# Patient Record
Sex: Female | Born: 1965 | Race: Black or African American | Hispanic: No | Marital: Single | Smoking: Never smoker
Health system: Southern US, Community
[De-identification: ages and names within clinical notes are randomized; demographics above are authoritative.]

## PROBLEM LIST (undated history)

## (undated) DIAGNOSIS — I10 Essential (primary) hypertension: Secondary | ICD-10-CM

---

## 2003-12-09 ENCOUNTER — Emergency Department (HOSPITAL_COMMUNITY): Admission: EM | Admit: 2003-12-09 | Discharge: 2003-12-10 | Payer: Self-pay | Admitting: Emergency Medicine

## 2003-12-09 ENCOUNTER — Emergency Department (HOSPITAL_COMMUNITY): Admission: EM | Admit: 2003-12-09 | Discharge: 2003-12-09 | Payer: Self-pay | Admitting: Emergency Medicine

## 2006-07-12 ENCOUNTER — Ambulatory Visit: Payer: Self-pay | Admitting: Cardiology

## 2006-07-12 ENCOUNTER — Inpatient Hospital Stay (HOSPITAL_COMMUNITY): Admission: EM | Admit: 2006-07-12 | Discharge: 2006-07-14 | Payer: Self-pay | Admitting: Emergency Medicine

## 2006-07-12 ENCOUNTER — Ambulatory Visit: Payer: Self-pay | Admitting: Family Medicine

## 2006-07-13 ENCOUNTER — Encounter: Payer: Self-pay | Admitting: Vascular Surgery

## 2006-07-13 ENCOUNTER — Encounter: Payer: Self-pay | Admitting: Cardiology

## 2006-12-25 ENCOUNTER — Emergency Department (HOSPITAL_COMMUNITY): Admission: EM | Admit: 2006-12-25 | Discharge: 2006-12-26 | Payer: Self-pay | Admitting: Emergency Medicine

## 2006-12-26 ENCOUNTER — Ambulatory Visit: Payer: Self-pay | Admitting: Psychiatry

## 2006-12-26 ENCOUNTER — Inpatient Hospital Stay (HOSPITAL_COMMUNITY): Admission: EM | Admit: 2006-12-26 | Discharge: 2006-12-28 | Payer: Self-pay | Admitting: Psychiatry

## 2007-04-23 ENCOUNTER — Emergency Department: Payer: Self-pay | Admitting: Emergency Medicine

## 2010-12-16 NOTE — H&P (Signed)
NAMEKRISTELL, [name]              ACCOUNT NO.:  [id number]   MEDICAL RECORD NO.:  [id number]          PATIENT TYPE:  INP   LOCATION:  3737                         FACILITY:  [facility]   PHYSICIAN:  Dwana Curd. Para March, M.D. DATE OF BIRTH:  01-20-1966   DATE OF ADMISSION:  07/12/2006  DATE OF DISCHARGE:                              HISTORY & PHYSICAL   CHIEF COMPLAINT:  Chest pain.   The patient is a 45 year old African-American female who has not seen a  doctor in years.  She had sudden onset chest pain and left arm pain last  night.  She has had a significant increase in her stress recently, both  at school and at work.  Her pain continued throughout the night.  She  states she stayed awake throughout the night because of the pain.  It  was still present this morning.  She went to work and says she was  overwhelmed at work over the stressful situation.  The chest pain  increased, and left arm pain progressed, having concurrent numbness.  She came to the emergency room via EMS.  After she was treated in the ED  with aspirin, nitroglycerin, and Ativan, her pain significantly  decreased.   She did have a similar period of chest pain several years ago where the  pain was controlled by taking Valium.  She says this was a short course  of treatment.   PAST MEDICAL HISTORY:  No hospitalizations.  No history of hypertension.  No history of diabetes.  She is obese.  The patient does have left arm  and leg weakness that is chronic.  It has been like this for several  months.  It was a sudden onset change earlier this year.   FAMILY HISTORY:  Her father is alive.  He has hypertension and has had  an MI.  Her mother is alive with diabetes.  Her son is obese.   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  She is also in graduate school at [employer].  She does not use tobacco nor alcohol nor illicit drugs.   DRUG ALLERGIES:  NO KNOWN  DRUG ALLERGIES.   MEDICATIONS:  None.   REVIEW OF SYSTEMS:  No fevers, no chills.  Positive shortness of breath  and chest pain, listed above.  No bright red blood per rectum.  Positive  bilateral lower extremity, especially in the p.m., especially after  eating salty food.  Review of systems otherwise noncontributory.   PHYSICAL EXAMINATION:  Temperature 97.2, pulse 88, respiratory rate 26,  blood pressure 156/79, SGOT of 100% on room air.  GENERAL:  Patient is  in no apparent distress.  HEENT:  Atraumatic, normocephalic.  Mucous membranes are moist.  Extraocular movements are intact and pupils equal, round, and reactive  to light.  Tympanic membranes within normal limits bilaterally.  NECK:  Supple.  No lymphadenopathy, no thyromegaly.  No JVD appreciated,  no bruit.  CHEST:  Clear to auscultation bilaterally.  Chest wall is minimally  tender to palpation.  The left  trapezius is tender to palpation, more so  than the rest of the chest wall.  The heart is a regular rate and rhythm  with a soft systolic ejection murmur appreciated.  Abdomen:  Soft,  positive bowel sounds, obese, nontender.  GU is deferred.  I am unable  to roll the patient in the bed secondary to her habitus to perform a  rectal exam.  EXTREMITIES:  2+ bilateral lower extremity edema.  SKIN:  No rash.  NEUROLOGIC:  Cranial nerves intact grossly bilaterally.  Cranial nerves  grossly intact bilaterally.  In terms of motor function, the patient is  diffusely weak on the left side as compared to the right.  She is weak  in forearm flexion and extension, also with grip in the left compared to  the right.  Her plantar and dorsal flexion is weaker on the left  compared to the right.  Her reflexes are diminished on the left upper  and lower extremity compared to the right.  Gait was not tested.   LABORATORY DATA:  EKG shows sinus arrhythmia with 90 beats per minute,  no ST changes.  BNP less than 30.  Point of care  troponin enzyme 0.1.  Chest x-ray is negative showing no acute cardiopulmonary disease.  Head  CT is negative.  Electrolytes:  Sodium 137, potassium 3.8, chloride 106,  bicarb 23.5, BUN 9, creatinine 0.8, glucose 151.  Blood gas 7.369.   ASSESSMENT AND PLAN:  The patient is a 45 year old African-American  female with the following problems:  1. Chest pain:  Anxiety versus coronary versus gastrointestinal      source.  Will admit her and rule out for myocardial infarction with      cardiac enzymes and EKG.  She should be placed on PPI for possible      heartburn.  This would be less likely.  Anxiety is high on the      differential.  She is being treated with aspirin and nitroglycerin      p.r.n.  We will also check a TSH.  PE is highly unlikely;      therefore, I did not pursue workup with a D-dimer or angiogram.  2. Increased blood pressure:  Will follow this.  Patient will likely      need antihypertensive treatment.  I did not see the need to acutely      lower her blood pressure tonight.  See problem #3.  3. Possible cerebrovascular accident.  Her CT is negative.  Will      likely need an OT/PT counsel.  Will also check Dopplers, even      though I did not hear a bruit in her neck.  I can discuss this with      an inpatient team in the morning.  4. Obesity:  Given all of her risk factors above, it is reasonable to      check a fasting lipid panel, A1c, and follow her CBGs.  She may, in      fact, have a frank diagnosis of diabetes with multiple fasting CBGs      that are elevated.  5. SCDs for DVT prophylaxis.  6. Admit.  7. Code status:  I had a frank discussion with the patient about her      code status.  She wishes to be DNR.  I explained to her that this      would mean no mechanical support for ventilation and no CPR.  She  opts for this should she become apneic or asystolic.      Dwana Curd Para March, M.D.    GSD/MEDQ  D:  07/13/2006  T:  07/13/2006  Job:  [id number]

## 2010-12-16 NOTE — Discharge Summary (Signed)
[name], [name]              ACCOUNT NO.:  [id number]   MEDICAL RECORD NO.:  [id number]          PATIENT TYPE:  INP   LOCATION:  3737                         FACILITY:  [facility]   PHYSICIAN:  Broadus John T. Pickard II, MDDATE OF BIRTH:  [birth year]   DATE OF ADMISSION:  07/12/2006  DATE OF DISCHARGE:                               DISCHARGE SUMMARY   PRIMARY CARE PHYSICIAN:  Unassigned.   DISCHARGE DIAGNOSES:  1. Atypical chest pain.  Rule out myocardial infarction.  2. Left upper extremity paresthesias.  Rule out cerebral vascular      accident.  3. Hypertension.  4. New onset diabetes mellitus type 2.  5. Gastroesophageal reflux disease.   PROCEDURES:  1. Patient had a 2-D echo of the chest performed on July 13, 2006,      which showed an ejection fraction of 70%.  There were no left      ventricular regional wall abnormalities.  There was no cardiac      embolic source of embolization.  2. Patient had 2-D carotid Dopplers performed on July 13, 2006.      The preliminary report shows no ICA stenosis or no embolic source      for a CVA.  3. Patient had a head CT on July 12, 2006, which showed no acute      disease, no evidence of a stroke.   LABORATORY DATA:  Patient had cardiac enzymes negative x3.  She has an  H. pylori test pending at the time of this dictation.  She had a  hemoglobin A1c of 8.5.  She had a fasting lipid panel showed  triglycerides of 181, HDL 48, LDL 146.  TSH 1.221.  Her BMP shows sodium  136, potassium 3.4, chloride 103, bicarb 26, glucose 214, BUN 11,  creatinine 0.7.   HISTORY AND PHYSICAL:  Please see the dictated history and physical in  the chart.  In short, the patient is a 45 year old morbidly obese  African-American female who presented with atypical chest pain, and left  upper extremity paresthesias.  She was admitted for those above reasons.   HOSPITAL COURSE:  1. Chest pain.  Patient was admitted to telemetry and had a cardiac     rule out for myocardial infarction.  She had EKGs that were      negative for acute ST elevations.  She had cardiac enzymes negative      x3.  It was felt that the cause of her chest pain was likely a GI      pathology coming from the esophagus or stomach.  She was started on      PPI twice daily and her chest pain resolved.  She is to be      scheduled for outpatient Cardiolite given her multiple risk      factors.  Would appreciate if Dr. Tanya Nones would schedule this as an      outpatient with [facility] at her      followup this week.  2. Left upper extremity paresthesias.  Question of whether or not this  was somatic secondary to stress versus a true CVA.  She had been      under increased stress as she is getting 2 masters degrees, she is      a single mother, and also working 2 jobs.  She recently had a lot      of papers due for school and had felt a lot of stress with that and      it was some concern that the paraesthesia may be related to that or      possibly her diabetes.  She had a head CT that was negative for any      evidence of a stroke.  She had carotid Dopplers that were negative      for any ICA stenosis.  She had a 2-D echo of the chest that showed      no embolic source of the heart.  She is to be sent home on aspirin      81 mg p.o. daily.  If her symptoms persist, I would get an MRI as      an outpatient to evaluate and see if she truly has had a stroke.      If there is evidence of a stroke on the MRI will then switch her      from aspirin to Aggrenox therapy for secondary prevention.  We are      to work on multiple co-morbidities such as her obesity, her      hypertension, her diabetes, as well as her hyperlipidemia.  3. New onset diabetes mellitus type 2.  Patient had CBGs in hospital      that ranged from 156 to 236.  She had a hemoglobin A1c in the      hospital that was found to be 8.5.  A long discussion was held with       the patient and this was likely related to her morbid obesity.  We      recommended starting medical therapy for this at this point.  Given      the fact that she is asymptomatic and average blood sugars ranging      around 200, we started her on metformin 500 mg p.o. b.i.d.  We will      up titrate over the next couple weeks to a maximum dose of 1000 mg      p.o. b.i.d. in an effort to get her hemoglobin A1c less than 6.3.      As a secondary adjunctive therapy, we will also arrange her to meet      with the diabetic nutrition management center as an outpatient.  4. GI source of chest pain.  Patient was started on Protonix 40 mg      p.o. b.i.d.  Her chest pain got better.  We want to get an upper GI      series in an inpatient; however, due to scheduling conflicts over      the weekend this is not possible.  Therefore, her followup      appointment this week, we would appreciate if Dr. Tanya Nones would      arrange her for an upper GI series to evaluate for esophageal      spasm, stricture, hiatal hernia, or another GI source of her chest      pain.  She is being sent home on Protonix 40 mg p.o. b.i.d. in the      meantime.  H. pylori tests are pending at  the time of this      dictation and if she is H. pylori positive, use 2 weeks antibiotic      therapy.  5. Hypertension.  Blood pressures in the hospital range from 130-      150/98-99.  Started on lisinopril 10 mg p.o. daily since she is a      diabetic.  Would appreciate if Dr. Tanya Nones would check a BMP at her      followup appointment to evaluate her creatinine on the lisinopril.      Would also get urine microalbumin at her followup appointment.  6. Hyperlipidemia.  Her LDL is not at goal.  Recommended diet and      exercise over the next 3 months.  Also recommend over the counter      fish oil tablets as well as almonds and meeting with the diabetic     nutrition management center.  In 3 months if her LDL is not at the      goal of  100, I would then start a statin therapy.  She is      recalcitrant to starting at this point and so we will give her some      time to see if she can bring it down on her own.   DISCHARGE INSTRUCTIONS:  Patient is instructed to call [facility] at [phone] and arrange a followup appointment this week  with Dr. Tanya Nones.   DISCHARGE MEDICATIONS:  1. Aspirin 81 mg p.o. daily.  2. Metformin 500 mg 1 p.o. b.i.d.  3. Lisinopril 10 mg p.o. daily.  4. Protonix 40 mg p.o. b.i.d.      Broadus John T. Pamalee Leyden, MD     WTP/MEDQ  D:  07/14/2006  T:  07/15/2006  Job:  [id number]   cc:   Broadus John T. Pamalee Leyden, MD

## 2010-12-16 NOTE — Discharge Summary (Signed)
[name], [name]              ACCOUNT NO.:  [id number]   MEDICAL RECORD NO.:  [id number]          PATIENT TYPE:  IPS   LOCATION:  0602                          [facility]ILITY:  [facility]   PHYSICIAN:  Anselm Jungling, MD  DATE OF BIRTH:  1966/05/10   DATE OF ADMISSION:  12/26/2006  DATE OF DISCHARGE:  12/28/2006                               DISCHARGE SUMMARY   IDENTIFYING DATA/REASON FOR ADMISSION:  This was an inpatient  psychiatric admission for [name], a 45 year old woman admitted due to  increasing and untreated depression, and suicidal ideation with plan to  overdose on her diabetic medication.  She is a single mother, working,  and attending graduate school.  She was experiencing financial stressors  and was generally overwhelmed by all of her activities and  responsibilities.  Please refer to the admission note for further  details pertaining to the symptoms, circumstances and history that led  to her hospitalization.   INITIAL DIAGNOSTIC IMPRESSION:  She was given an initial AXIS I  diagnosis of depressive disorder not otherwise specified.   MEDICAL/LABORATORY:  The patient came to [place] as a diabetic, on a regimen  of metformin.  She was medically and physically assessed by the  psychiatric nurse practitioner, and her diabetic regimen was monitored  closely during her stay.  There were no significant medical issues.   HOSPITAL COURSE:  The patient was admitted to the adult inpatient  psychiatric service.  She presented as a well-nourished, well-developed  woman who was alert, fully oriented, and very pleasant but sad.  She  showed bright intellect, was articulate, and a dry wit.  There were no  signs or symptoms of psychosis or thought disorder.  She admitted to  suicidal ideation prior to admission, but once admitted, indicated that  she was no longer feeling that way and verbalized a strong desire for  help.  She denied any history of alcohol or substance abuse.   She was  involved in the therapeutic milieu and attended various groups  and activities geared towards helping her acquire better coping skills,  a better understanding of her underlying depression and the development  of an aftercare plan.   She had never been treated with any psychotropic medication for  regulation of mood, and agreed to a trial of Zoloft 50 mg daily.   On the third hospital day, the patient had remained absent suicidal  ideation, was much more hopeful, and was tolerating her initial Zoloft.  She indicated that she felt ready to continue treatment in the  outpatient setting.  She agreed to the following aftercare plan.   AFTERCARE:  The patient was to report to the [facility] on January 01, 2007 for an intake appointment with Dr. Mikey Bussing at 8:30 a.m.   DISCHARGE MEDICATIONS:  1. Zoloft 50 mg daily.  2. Metformin 500 mg b.i.d.  3. Lescol 20 mg daily.   DISCHARGE DIAGNOSES:  AXIS I:  Major depressive episode.  AXIS II:  Deferred.  AXIS III:  History of diabetes mellitus.  AXIS IV:  Stressors:  Severe.  AXIS V:  GAF on  discharge 70.      Anselm Jungling, MD  Electronically Signed     SPB/MEDQ  D:  12/31/2006  T:  01/01/2007  Job:  [id number]

## 2011-11-20 ENCOUNTER — Emergency Department (HOSPITAL_COMMUNITY)
Admission: EM | Admit: 2011-11-20 | Discharge: 2011-11-20 | Disposition: A | Discharge status: Home / Self Care | Payer: Self-pay | Attending: Emergency Medicine | Admitting: Emergency Medicine

## 2011-11-20 ENCOUNTER — Encounter (HOSPITAL_COMMUNITY): Payer: Self-pay | Admitting: *Deleted

## 2011-11-20 ENCOUNTER — Emergency Department (HOSPITAL_COMMUNITY): Payer: Self-pay

## 2011-11-20 DIAGNOSIS — R079 Chest pain, unspecified: Secondary | ICD-10-CM | POA: Insufficient documentation

## 2011-11-20 DIAGNOSIS — J45909 Unspecified asthma, uncomplicated: Secondary | ICD-10-CM | POA: Insufficient documentation

## 2011-11-20 DIAGNOSIS — I10 Essential (primary) hypertension: Secondary | ICD-10-CM | POA: Insufficient documentation

## 2011-11-20 DIAGNOSIS — R0602 Shortness of breath: Secondary | ICD-10-CM | POA: Insufficient documentation

## 2011-11-20 DIAGNOSIS — R509 Fever, unspecified: Secondary | ICD-10-CM | POA: Insufficient documentation

## 2011-11-20 DIAGNOSIS — E119 Type 2 diabetes mellitus without complications: Secondary | ICD-10-CM | POA: Insufficient documentation

## 2011-11-20 DIAGNOSIS — J3489 Other specified disorders of nose and nasal sinuses: Secondary | ICD-10-CM | POA: Insufficient documentation

## 2011-11-20 DIAGNOSIS — J111 Influenza due to unidentified influenza virus with other respiratory manifestations: Secondary | ICD-10-CM | POA: Insufficient documentation

## 2011-11-20 DIAGNOSIS — IMO0001 Reserved for inherently not codable concepts without codable children: Secondary | ICD-10-CM | POA: Insufficient documentation

## 2011-11-20 DIAGNOSIS — R110 Nausea: Secondary | ICD-10-CM | POA: Insufficient documentation

## 2011-11-20 DIAGNOSIS — R5381 Other malaise: Secondary | ICD-10-CM | POA: Insufficient documentation

## 2011-11-20 HISTORY — DX: Essential (primary) hypertension: I10

## 2011-11-20 LAB — GLUCOSE, CAPILLARY: Glucose-Capillary: 240 mg/dL — ABNORMAL HIGH (ref 70–99)

## 2011-11-20 LAB — BASIC METABOLIC PANEL
Creatinine, Ser: 0.67 mg/dL (ref 0.50–1.10)
GFR calc non Af Amer: >90 mL/min (ref >90)
Glucose, Bld: 233 mg/dL — ABNORMAL HIGH (ref 70–99)
Potassium: 3.5 mEq/L (ref 3.5–5.1)
Sodium: 135 mEq/L (ref 135–145)

## 2011-11-20 LAB — CBC
Hemoglobin: 12.6 g/dL (ref 12.0–15.0)
MCV: 88.4 fL (ref 78.0–100.0)
WBC: 9.9 10*3/uL (ref 4.0–10.5)

## 2011-11-20 LAB — POCT I-STAT, CHEM 8
BUN: 9 mg/dL (ref 6–23)
Calcium, Ion: 1.21 mmol/L (ref 1.12–1.32)
Creatinine, Ser: 0.6 mg/dL (ref 0.50–1.10)
Glucose, Bld: 223 mg/dL — ABNORMAL HIGH (ref 70–99)
Potassium: 3.6 mEq/L (ref 3.5–5.1)

## 2011-11-20 LAB — TROPONIN I: Troponin I: <0.3 ng/mL (ref <0.30)

## 2011-11-20 MED ORDER — OSELTAMIVIR PHOSPHATE 75 MG PO CAPS
75.0000 mg | ORAL_CAPSULE | Freq: Once | ORAL | Status: AC
  Administered 2011-11-20: 75 mg via ORAL
  Filled 2011-11-20 (×2): qty 1

## 2011-11-20 MED ORDER — OSELTAMIVIR PHOSPHATE 75 MG PO CAPS: 75.0000 mg | ORAL_CAPSULE | Freq: Two times a day (BID) | ORAL | Status: AC

## 2011-11-20 MED ORDER — OSELTAMIVIR PHOSPHATE 75 MG PO CAPS: 75.0000 mg | ORAL_CAPSULE | Freq: Two times a day (BID) | ORAL | Status: DC

## 2011-11-20 MED ORDER — SODIUM CHLORIDE 0.9 % IV BOLUS (SEPSIS)
1000.0000 mL | Freq: Once | INTRAVENOUS | Status: AC
  Administered 2011-11-20: 1000 mL via INTRAVENOUS

## 2011-11-20 NOTE — ED Notes (Signed)
Patient currently sitting up in bed; no respiratory or acute distress noted.  Patient updated on plan of care; informed patient that we are currently waiting for bolus to finish.  Patient has no other questions or concerns at this time; will continue to monitor.

## 2011-11-20 NOTE — ED Notes (Signed)
Received bedside report from [place], [place].  Patient currently sitting up in bed; no respiratory or acute distress noted.  Patient updated on plan of care; informed patient that lab is going to be by to do lab work and that she is going to go for x-ray for a chest x-ray.  Patient has no other questions or concerns at this time; will continue to monitor.

## 2011-11-20 NOTE — ED Notes (Signed)
Waiting for Tamiflu from pharmacy before discharging patient.

## 2011-11-20 NOTE — ED Notes (Signed)
Patient asking for something to eat; patient given [place] sandwich Gildardo Cranker by FNP).

## 2011-11-20 NOTE — ED Notes (Signed)
Gail, FNP at bedside. 

## 2011-11-20 NOTE — ED Notes (Signed)
Patient currently resting quietly in bed; no respiratory or acute distress noted.  No respiratory or acute distress noted.  Patient updated on plan of care; informed patient that we are waiting for lab results to come back.  Patient has no other questions or concerns at this time; will continue to monitor.

## 2011-11-20 NOTE — ED Notes (Signed)
PT family member drove PT to ED after finding her [facility] and having CP . CBG 220 . PT to weak to stand

## 2011-11-20 NOTE — ED Provider Notes (Signed)
History     CSN: [id number]  Arrival date & time 11/20/11  1716   First MD Initiated Contact with Patient 11/20/11 2006      Chief Complaint  Patient presents with  . Chest Pain    (Consider location/radiation/quality/duration/timing/severity/associated sxs/prior treatment) HPI Comments: Patient stated that Saturday night.  She felt slightly uncomfortable in her nasal area with slight congestion.  Sunday.  The same.  This morning she woke up and went to work, but had acute onset of fever, myalgias, increased nasal congestion, sinus pain, a slight feeling of shortness of breath on exertion, fatigue.  She was sent home from work, took an Alka-Seltzer plus blade, down and felt worse.  She called a friend to bring her to the emergency room because of her myalgias and feeling short of breath.  She is a non-insulin-dependent diabetic.  His sugars normally run between 100 and 180.  She had one episode of vomiting and loose stool just prior to arrival  Patient is a 46 y.o. female presenting with chest pain. The history is provided by the patient.  Chest Pain The chest pain began yesterday. Chest pain occurs constantly. The chest pain is worsening. At its most intense, the pain is at 3/10. The pain is currently at 3/10. The severity of the pain is moderate. The quality of the pain is described as aching. Primary symptoms include a fever, shortness of breath and nausea. Pertinent negatives for primary symptoms include no cough and no dizziness.  Associated symptoms include weakness.  Pertinent negatives for associated symptoms include no diaphoresis.     Past Medical History  Diagnosis Date  . Diabetes mellitus   . Hypertension   . Asthma     Past Surgical History  Procedure Date  . Cesarean section     No family history on file.  History  Substance Use Topics  . Smoking status: Not on file  . Smokeless tobacco: Not on file  . Alcohol Use:     OB History    Grav Para Term Preterm  Abortions TAB SAB Ect Mult Living                  Review of Systems  Constitutional: Positive for fever. Negative for chills and diaphoresis.  HENT: Positive for sinus pressure. Negative for sore throat.   Respiratory: Positive for shortness of breath. Negative for cough.   Cardiovascular: Positive for chest pain.  Gastrointestinal: Positive for nausea.  Neurological: Positive for weakness. Negative for dizziness and headaches.    Allergies  Review of patient's allergies indicates no known allergies.  Home Medications   Current Outpatient Rx  Name Route Sig Dispense Refill  . B-COMPLEX PO Oral Take 1 tablet by mouth daily.    . ADULT MULTIVITAMIN W/MINERALS CH Oral Take 1 tablet by mouth daily.    . OSELTAMIVIR PHOSPHATE 75 MG PO CAPS Oral Take 1 capsule (75 mg total) by mouth every 12 (twelve) hours. 10 capsule 0    BP 178/88  Pulse 104  Temp(Src) 98.7 F (37.1 C) (Oral)  Resp 18  SpO2 100%  LMP 11/06/2011  Physical Exam  Constitutional: She is oriented to person, place, and time. She appears well-developed and well-nourished.  HENT:  Head: Normocephalic and atraumatic.  Nose: Right sinus exhibits maxillary sinus tenderness and frontal sinus tenderness. Left sinus exhibits maxillary sinus tenderness and frontal sinus tenderness.  Eyes: Pupils are equal, round, and reactive to light.  Neck: Normal range of motion.  Pulmonary/Chest:  She has no wheezes. She exhibits no tenderness.  Abdominal: Soft.  Musculoskeletal: Normal range of motion.  Neurological: She is alert and oriented to person, place, and time.  Skin: Skin is warm and dry.    ED Course  Procedures (including critical care time)  Labs Reviewed  GLUCOSE, CAPILLARY - Abnormal; Notable for the following:    Glucose-Capillary 240 (*)    All other components within normal limits  BASIC METABOLIC PANEL - Abnormal; Notable for the following:    Glucose, Bld 233 (*)    All other components within normal  limits  POCT I-STAT, CHEM 8 - Abnormal; Notable for the following:    Glucose, Bld 223 (*)    All other components within normal limits  CBC  TROPONIN I  POCT I-STAT TROPONIN I   Dg Chest 2 View  11/20/2011  *RADIOLOGY REPORT*  Clinical Data: Left chest pain, shortness of breath  CHEST - 2 VIEW  Comparison: 07/13/2006  Findings: Lungs clear.  Heart size and pulmonary vascularity normal.  No effusion.  Visualized bones unremarkable.  IMPRESSION: No acute disease  Original Report Authenticated By: Thora Lance III, M.D.     1. Influenza       MDM  After careful examination, and review of this patient's symptoms.  I feel that this is an acute viral syndrome versus influenza.  Do not feel this is cardiac in nature, but will check EKG chest x-ray, and one set of cardiac enzymes.  I have asked that she be hydrated 10:30 PM patient is feeling significantly better has eaten a sandwich without nausea.  Will discharge home with the diagnoses of influenza       Arman Filter, NP 11/21/11 0606  Arman Filter, NP 11/21/11 [phone]

## 2011-11-20 NOTE — ED Notes (Signed)
Patient currently sitting up in bed; no respiratory or acute distress noted.  Patient updated on plan of care; informed patient that we are currently waiting on disposition from FNP.  Patient has no other questions or concerns at this time; will continue to monitor.

## 2011-11-21 NOTE — ED Provider Notes (Signed)
Medical screening examination/treatment/procedure(s) were performed by non-physician practitioner and as supervising physician I was immediately available for consultation/collaboration.   Glynn Octave, MD 11/21/11 1218

## 2012-10-06 ENCOUNTER — Encounter (HOSPITAL_COMMUNITY): Payer: Self-pay | Admitting: *Deleted

## 2012-10-06 ENCOUNTER — Emergency Department (HOSPITAL_COMMUNITY)
Admission: EM | Admit: 2012-10-06 | Discharge: 2012-10-06 | Disposition: A | Discharge status: Home / Self Care | Payer: No Typology Code available for payment source | Attending: Emergency Medicine | Admitting: Emergency Medicine

## 2012-10-06 ENCOUNTER — Emergency Department (HOSPITAL_COMMUNITY): Payer: No Typology Code available for payment source

## 2012-10-06 DIAGNOSIS — T628X1A Toxic effect of other specified noxious substances eaten as food, accidental (unintentional), initial encounter: Secondary | ICD-10-CM | POA: Insufficient documentation

## 2012-10-06 DIAGNOSIS — L299 Pruritus, unspecified: Secondary | ICD-10-CM | POA: Insufficient documentation

## 2012-10-06 DIAGNOSIS — R062 Wheezing: Secondary | ICD-10-CM | POA: Insufficient documentation

## 2012-10-06 DIAGNOSIS — M7989 Other specified soft tissue disorders: Secondary | ICD-10-CM | POA: Insufficient documentation

## 2012-10-06 DIAGNOSIS — L272 Dermatitis due to ingested food: Secondary | ICD-10-CM | POA: Insufficient documentation

## 2012-10-06 DIAGNOSIS — I10 Essential (primary) hypertension: Secondary | ICD-10-CM | POA: Insufficient documentation

## 2012-10-06 DIAGNOSIS — M25579 Pain in unspecified ankle and joints of unspecified foot: Secondary | ICD-10-CM | POA: Insufficient documentation

## 2012-10-06 DIAGNOSIS — L509 Urticaria, unspecified: Secondary | ICD-10-CM | POA: Insufficient documentation

## 2012-10-06 DIAGNOSIS — Y92009 Unspecified place in unspecified non-institutional (private) residence as the place of occurrence of the external cause: Secondary | ICD-10-CM | POA: Insufficient documentation

## 2012-10-06 DIAGNOSIS — R1110 Vomiting, unspecified: Secondary | ICD-10-CM | POA: Insufficient documentation

## 2012-10-06 DIAGNOSIS — Y9389 Activity, other specified: Secondary | ICD-10-CM | POA: Insufficient documentation

## 2012-10-06 DIAGNOSIS — R6889 Other general symptoms and signs: Secondary | ICD-10-CM | POA: Insufficient documentation

## 2012-10-06 DIAGNOSIS — Z79899 Other long term (current) drug therapy: Secondary | ICD-10-CM | POA: Insufficient documentation

## 2012-10-06 DIAGNOSIS — J45909 Unspecified asthma, uncomplicated: Secondary | ICD-10-CM | POA: Insufficient documentation

## 2012-10-06 DIAGNOSIS — E119 Type 2 diabetes mellitus without complications: Secondary | ICD-10-CM | POA: Insufficient documentation

## 2012-10-06 LAB — CBC WITH DIFFERENTIAL/PLATELET
Basophils Absolute: 0 10*3/uL (ref 0.0–0.1)
Basophils Relative: 0 % (ref 0–1)
HCT: 42.9 % (ref 36.0–46.0)
MCH: 29.7 pg (ref 26.0–34.0)
MCHC: 33.3 g/dL (ref 30.0–36.0)
Monocytes Absolute: 0.4 10*3/uL (ref 0.1–1.0)
Neutrophils Relative %: 86 % — ABNORMAL HIGH (ref 43–77)
Platelets: 253 10*3/uL (ref 150–400)
RBC: 4.82 MIL/uL (ref 3.87–5.11)
WBC: 15.5 10*3/uL — ABNORMAL HIGH (ref 4.0–10.5)

## 2012-10-06 LAB — COMPREHENSIVE METABOLIC PANEL
ALT: 10 U/L (ref 0–35)
AST: 14 U/L (ref 0–37)
Albumin: 3.3 g/dL — ABNORMAL LOW (ref 3.5–5.2)
BUN: 11 mg/dL (ref 6–23)
CO2: 22 mEq/L (ref 19–32)
Glucose, Bld: 340 mg/dL — ABNORMAL HIGH (ref 70–99)
Total Protein: 7.7 g/dL (ref 6.0–8.3)

## 2012-10-06 MED ORDER — PREDNISONE 50 MG PO TABS: ORAL_TABLET | ORAL | Status: DC

## 2012-10-06 MED ORDER — DIPHENHYDRAMINE HCL 25 MG PO TABS: 25.0000 mg | ORAL_TABLET | Freq: Four times a day (QID) | ORAL | Status: DC

## 2012-10-06 MED ORDER — EPINEPHRINE 0.3 MG/0.3ML IJ DEVI: 0.3000 mg | INTRAMUSCULAR | Status: DC | PRN

## 2012-10-06 MED ORDER — DIPHENHYDRAMINE HCL 50 MG/ML IJ SOLN
25.0000 mg | Freq: Once | INTRAMUSCULAR | Status: AC
  Administered 2012-10-06: 25 mg via INTRAVENOUS
  Filled 2012-10-06: qty 1

## 2012-10-06 NOTE — ED Notes (Addendum)
Per ems pt is from home. Hx of diabetes. Pt reports this morning she ate "possibly some bad clearance ham". Reports ham tasted funny and throat felt funny. Pt went to [facility] to get benadryl, [facility] was out of benadryl. Pt called ems. Upon arrival pt diaphoretic, hives, vomiting, and reports sudden onset feet swelling. Pain in feet 8/10. No wheezing noted. Pt had IV established given 50mg  benadryl, 50mg  zantac, 125mg  solumedrol. IV dislodged while pt was getting dressed.

## 2012-10-06 NOTE — ED Provider Notes (Signed)
History     CSN: [id number]  Arrival date & time 10/06/12  1527   First MD Initiated Contact with Patient 10/06/12 [phone]      Chief Complaint  Patient presents with  . Allergic Reaction    (Consider location/radiation/quality/duration/timing/severity/associated sxs/prior treatment) HPI Comments: Patient presents from home with allergic reaction after eating ham. She states she hit her on 11:30 AM and then immediately began to feel short of breath with tightness in her throat and diffuse itching and hives. She went to the store to buy Benadryl but did not have any so she called EMS. EMS found her to be diaphoretic with hives and diffuse itching. She was given Benadryl, Zantac and Solu-Medrol with good relief. She feels much better. She denies any chest pain or shortness of breath. Her throat still feels tingly. She endorses pain and swelling in her bilateral feet. Denies anybody else is sick contacts.  The history is provided by the patient.    Past Medical History  Diagnosis Date  . Diabetes mellitus   . Hypertension   . Asthma     Past Surgical History  Procedure Laterality Date  . Cesarean section      History reviewed. No pertinent family history.  History  Substance Use Topics  . Smoking status: Not on file  . Smokeless tobacco: Not on file  . Alcohol Use:     OB History   Grav Para Term Preterm Abortions TAB SAB Ect Mult Living                  Review of Systems  Constitutional: Negative for fever and activity change.  HENT: Negative for congestion and rhinorrhea.   Respiratory: Positive for shortness of breath. Negative for cough and chest tightness.   Cardiovascular: Negative for chest pain.  Gastrointestinal: Negative for nausea, vomiting and abdominal pain.  Genitourinary: Negative for dysuria, hematuria, vaginal bleeding and vaginal discharge.  Musculoskeletal: Negative for back pain.  Skin: Positive for rash. Negative for wound.  Neurological: Negative  for dizziness, weakness and headaches.  A complete 10 system review of systems was obtained and all systems are negative except as noted in the HPI and PMH.    Allergies  Review of patient's allergies indicates no known allergies.  Home Medications   Current Outpatient Rx  Name  Route  Sig  Dispense  Refill  . metFORMIN (GLUCOPHAGE) 500 MG tablet   Oral   Take 500 mg by mouth 2 (two) times daily with a meal.         . Multiple Vitamin (MULITIVITAMIN WITH MINERALS) TABS   Oral   Take 1 tablet by mouth daily.         . diphenhydrAMINE (BENADRYL) 25 MG tablet   Oral   Take 1 tablet (25 mg total) by mouth every 6 (six) hours.   20 tablet   0   . EPINEPHrine (EPIPEN) 0.3 mg/0.3 mL DEVI   Intramuscular   Inject 0.3 mLs (0.3 mg total) into the muscle as needed.   1 Device   0   . predniSONE (DELTASONE) 50 MG tablet      1 tablet PO daily   5 tablet   0     BP 139/92  Pulse 105  Temp(Src) 97.8 F (36.6 C) (Oral)  Resp 21  SpO2 98%  LMP 09/06/2012  Physical Exam  Constitutional: She is oriented to person, place, and time. She appears well-developed and well-nourished. No distress.  Speaking in full  senses, no distress  HENT:  Head: Normocephalic and atraumatic.  Mouth/Throat: Oropharynx is clear and moist. No oropharyngeal exudate.  Throat is clear, uvula is midline there is no oral edema, no tongue swelling.  Eyes: Conjunctivae and EOM are normal. Pupils are equal, round, and reactive to light.  Neck: Normal range of motion. Neck supple.  Cardiovascular: Normal rate, regular rhythm and normal heart sounds.   No murmur heard. Pulmonary/Chest: Effort normal and breath sounds normal. No respiratory distress. She has no wheezes.  Abdominal: Soft. There is no tenderness. There is no rebound and no guarding.  Musculoskeletal: Normal range of motion. She exhibits no edema and no tenderness.  Neurological: She is alert and oriented to person, place, and time. No  cranial nerve deficit. She exhibits normal muscle tone. Coordination normal.  Skin: Skin is warm. Rash noted.  Scattered erythematous rash to neck, chest and bilateral arms    ED Course  Procedures (including critical care time)  Labs Reviewed  CBC WITH DIFFERENTIAL - Abnormal; Notable for the following:    WBC 15.5 (*)    Neutrophils Relative 86 (*)    Neutro Abs 13.3 (*)    Monocytes Relative 2 (*)    All other components within normal limits  COMPREHENSIVE METABOLIC PANEL - Abnormal; Notable for the following:    Sodium 133 (*)    Glucose, Bld 340 (*)    Albumin 3.3 (*)    All other components within normal limits  TROPONIN I   Dg Chest 2 View  10/06/2012  *RADIOLOGY REPORT*  Clinical Data: Shortness of breath  CHEST - 2 VIEW  Comparison: 11/20/2011  Findings: Lungs are clear. No pleural effusion or pneumothorax.  Cardiomediastinal silhouette is within normal limits.  Mild degenerative changes of the visualized thoracolumbar spine.  IMPRESSION: No evidence of acute cardiopulmonary disease.   Original Report Authenticated By: Charline Bills, M.D.      1. Allergic reaction, initial encounter       MDM  Allergic reaction possibly to "bad ham". Hives, vomiting, wheezing had improved after medications and steroids and antihistamines. No evidence of airway compromise.  Patient observed in ED with no deterioration in condition. Leukocytosis. Attributed to steroid use.  Rashes improved. There is no difficulty breathing or wheezing. Oropharynx is clear. No uvular edema.  We'll treat with antihistamines. EpiPen given for emergency use only. Followup with PCP.   Date: 10/06/2012  Rate: 87  Rhythm: normal sinus rhythm  QRS Axis: normal  Intervals: normal  ST/T Wave abnormalities: normal  Conduction Disutrbances:none  Narrative Interpretation:   Old EKG Reviewed: unchanged         Glynn Octave, MD 10/06/12 [phone]

## 2012-10-06 NOTE — ED Notes (Signed)
ZOX:[id number]<AV> Expected date:10/06/12<BR> Expected time: 3:23 PM<BR> Means of arrival:Ambulance<BR> Comments:<BR> Allergic reaction

## 2012-10-06 NOTE — ED Notes (Signed)
Pt escorted to discharge window. Pt verbalized understanding discharge instructions. In no acute distress.  

## 2013-06-05 ENCOUNTER — Other Ambulatory Visit: Payer: Self-pay

## 2013-11-27 ENCOUNTER — Ambulatory Visit: Payer: Self-pay | Admitting: Family Medicine

## 2013-11-28 ENCOUNTER — Encounter: Payer: Self-pay | Admitting: Family Medicine

## 2013-12-17 ENCOUNTER — Ambulatory Visit: Payer: Self-pay | Admitting: Family Medicine

## 2015-01-10 ENCOUNTER — Encounter (HOSPITAL_COMMUNITY): Payer: Self-pay | Admitting: Emergency Medicine

## 2015-01-10 ENCOUNTER — Emergency Department (HOSPITAL_COMMUNITY)
Admission: EM | Admit: 2015-01-10 | Discharge: 2015-01-10 | Disposition: A | Discharge status: Home / Self Care | Payer: 59 | Attending: Emergency Medicine | Admitting: Emergency Medicine

## 2015-01-10 DIAGNOSIS — Z3202 Encounter for pregnancy test, result negative: Secondary | ICD-10-CM | POA: Insufficient documentation

## 2015-01-10 DIAGNOSIS — I159 Secondary hypertension, unspecified: Secondary | ICD-10-CM | POA: Diagnosis not present

## 2015-01-10 DIAGNOSIS — Z7952 Long term (current) use of systemic steroids: Secondary | ICD-10-CM | POA: Insufficient documentation

## 2015-01-10 DIAGNOSIS — B373 Candidiasis of vulva and vagina: Secondary | ICD-10-CM | POA: Diagnosis not present

## 2015-01-10 DIAGNOSIS — E119 Type 2 diabetes mellitus without complications: Secondary | ICD-10-CM | POA: Insufficient documentation

## 2015-01-10 DIAGNOSIS — Z79899 Other long term (current) drug therapy: Secondary | ICD-10-CM | POA: Insufficient documentation

## 2015-01-10 DIAGNOSIS — B3731 Acute candidiasis of vulva and vagina: Secondary | ICD-10-CM

## 2015-01-10 DIAGNOSIS — N898 Other specified noninflammatory disorders of vagina: Secondary | ICD-10-CM | POA: Diagnosis present

## 2015-01-10 DIAGNOSIS — Z8639 Personal history of other endocrine, nutritional and metabolic disease: Secondary | ICD-10-CM

## 2015-01-10 LAB — URINALYSIS, ROUTINE W REFLEX MICROSCOPIC
Bilirubin Urine: NEGATIVE
Glucose, UA: >1000 mg/dL — AB
Hgb urine dipstick: NEGATIVE
Ketones, ur: NEGATIVE mg/dL
Nitrite: NEGATIVE
Protein, ur: NEGATIVE mg/dL
Specific Gravity, Urine: 1.039 — ABNORMAL HIGH (ref 1.005–1.030)
Urobilinogen, UA: 0.2 mg/dL (ref 0.0–1.0)
pH: 5.5 (ref 5.0–8.0)

## 2015-01-10 LAB — POC URINE PREG, ED: Preg Test, Ur: NEGATIVE

## 2015-01-10 LAB — HIV ANTIBODY (ROUTINE TESTING W REFLEX): HIV Screen 4th Generation wRfx: NONREACTIVE

## 2015-01-10 LAB — WET PREP, GENITAL
Clue Cells Wet Prep HPF POC: NONE SEEN
Trich, Wet Prep: NONE SEEN
Yeast Wet Prep HPF POC: NONE SEEN

## 2015-01-10 LAB — RPR: RPR Ser Ql: NONREACTIVE

## 2015-01-10 LAB — URINE MICROSCOPIC-ADD ON

## 2015-01-10 MED ORDER — HYDROCHLOROTHIAZIDE 12.5 MG PO CAPS: 12.5000 mg | ORAL_CAPSULE | Freq: Every day | ORAL | Status: DC

## 2015-01-10 MED ORDER — NYSTATIN 100000 UNIT/GM EX CREA: TOPICAL_CREAM | CUTANEOUS | Status: DC

## 2015-01-10 MED ORDER — HYDROCHLOROTHIAZIDE 12.5 MG PO CAPS
12.5000 mg | ORAL_CAPSULE | Freq: Once | ORAL | Status: AC
  Administered 2015-01-10: 12.5 mg via ORAL
  Filled 2015-01-10: qty 1

## 2015-01-10 NOTE — Discharge Instructions (Signed)
Candida Infection A Candida infection (also called yeast, fungus, and Monilia infection) is an overgrowth of yeast that can occur anywhere on the body. A yeast infection commonly occurs in warm, moist body areas. Usually, the infection remains localized but can spread to become a systemic infection. A yeast infection may be a sign of a more severe disease such as diabetes, leukemia, or AIDS. A yeast infection can occur in both men and women. In women, Candida vaginitis is a vaginal infection. It is one of the most common causes of vaginitis. Men usually do not have symptoms or know they have an infection until other problems develop. Men may find out they have a yeast infection because their sex partner has a yeast infection. Uncircumcised men are more likely to get a yeast infection than circumcised men. This is because the uncircumcised glans is not exposed to air and does not remain as dry as that of a circumcised glans. Older adults may develop yeast infections around dentures. CAUSES  Women  Antibiotics.  Steroid medication taken for a long time.  Being overweight (obese).  Diabetes.  Poor immune condition.  Certain serious medical conditions.  Immune suppressive medications for organ transplant patients.  Chemotherapy.  Pregnancy.  Menstruation.  Stress and fatigue.  Intravenous drug use.  Oral contraceptives.  Wearing tight-fitting clothes in the crotch area.  Catching it from a sex partner who has a yeast infection.  Spermicide.  Intravenous, urinary, or other catheters. Men  Catching it from a sex partner who has a yeast infection.  Having oral or anal sex with a person who has the infection.  Spermicide.  Diabetes.  Antibiotics.  Poor immune system.  Medications that suppress the immune system.  Intravenous drug use.  Intravenous, urinary, or other catheters. SYMPTOMS  Women  Thick, white vaginal discharge.  Vaginal itching.  Redness and  swelling in and around the vagina.  Irritation of the lips of the vagina and perineum.  Blisters on the vaginal lips and perineum.  Painful sexual intercourse.  Low blood sugar (hypoglycemia).  Painful urination.  Bladder infections.  Intestinal problems such as constipation, indigestion, bad breath, bloating, increase in gas, diarrhea, or loose stools. Men  Men may develop intestinal problems such as constipation, indigestion, bad breath, bloating, increase in gas, diarrhea, or loose stools.  Dry, cracked skin on the penis with itching or discomfort.  Jock itch.  Dry, flaky skin.  Athlete's foot.  Hypoglycemia. DIAGNOSIS  Women  A history and an exam are performed.  The discharge may be examined under a microscope.  A culture may be taken of the discharge. Men  A history and an exam are performed.  Any discharge from the penis or areas of cracked skin will be looked at under the microscope and cultured.  Stool samples may be cultured. TREATMENT  Women  Vaginal antifungal suppositories and creams.  Medicated creams to decrease irritation and itching on the outside of the vagina.  Warm compresses to the perineal area to decrease swelling and discomfort.  Oral antifungal medications.  Medicated vaginal suppositories or cream for repeated or recurrent infections.  Wash and dry the irritation areas before applying the cream.  Eating yogurt with Lactobacillus may help with prevention and treatment.  Sometimes painting the vagina with gentian violet solution may help if creams and suppositories do not work. Men  Antifungal creams and oral antifungal medications.  Sometimes treatment must continue for 30 days after the symptoms go away to prevent recurrence. HOME CARE INSTRUCTIONS  Women  Use cotton underwear and avoid tight-fitting clothing.  Avoid colored, scented toilet paper and deodorant tampons or pads.  Do not douche.  Keep your diabetes  under control.  Finish all the prescribed medications.  Keep your skin clean and dry.  Consume milk or yogurt with Lactobacillus-active culture regularly. If you get frequent yeast infections and think that is what the infection is, there are over-the-counter medications that you can get. If the infection does not show healing in 3 days, talk to your caregiver.  Tell your sex partner you have a yeast infection. Your partner may need treatment also, especially if your infection does not clear up or recurs. Men  Keep your skin clean and dry.  Keep your diabetes under control.  Finish all prescribed medications.  Tell your sex partner that you have a yeast infection so he or she can be treated if necessary. SEEK MEDICAL CARE IF:   Your symptoms do not clear up or worsen in one week after treatment.  You have an oral temperature above 102 F (38.9 C).  You have trouble swallowing or eating for a prolonged time.  You develop blisters on and around your vagina.  You develop vaginal bleeding and it is not your menstrual period.  You develop abdominal pain.  You develop intestinal problems as mentioned above.  You get weak or light-headed.  You have painful or increased urination.  You have pain during sexual intercourse. MAKE SURE YOU:   Understand these instructions.  Will watch your condition. Candidal Vulvovaginitis Candidal vulvovaginitis is an infection of the vagina and vulva. The vulva is the skin around the opening of the vagina. This may cause itching and discomfort in and around the vagina.  HOME CARE Only take medicine as told by your doctor. Do not have sex (intercourse) until the infection is healed or as told by your doctor. Practice safe sex. Tell your sex partner about your infection. Do not douche or use tampons. Wear cotton underwear. Do not wear tight pants or panty hose. Eat yogurt. This may help treat and prevent yeast infections. GET HELP RIGHT  AWAY IF:  You have a fever. Your problems get worse during treatment or do not get better in 3 days. You have discomfort, irritation, or itching in your vagina or vulva area. You have pain after sex. You start to get belly (abdominal) pain. MAKE SURE YOU: Understand these instructions. Will watch your condition. Will get help right away if you are not doing  Hypertension Hypertension, commonly called high blood pressure, is when the force of blood pumping through your arteries is too strong. Your arteries are the blood vessels that carry blood from your heart throughout your body. A blood pressure reading consists of a higher number over a lower number, such as 110/72. The higher number (systolic) is the pressure inside your arteries when your heart pumps. The lower number (diastolic) is the pressure inside your arteries when your heart relaxes. Ideally you want your blood pressure below 120/80. Hypertension forces your heart to work harder to pump blood. Your arteries may become narrow or stiff. Having hypertension puts you at risk for heart disease, stroke, and other problems.  RISK FACTORS Some risk factors for high blood pressure are controllable. Others are not.  Risk factors you cannot control include:  Race. You may be at higher risk if you are African American. Age. Risk increases with age. Gender. Men are at higher risk than women before age 72 years. After  age 67, women are at higher risk than men. Risk factors you can control include: Not getting enough exercise or physical activity. Being overweight. Getting too much fat, sugar, calories, or salt in your diet. Drinking too much alcohol. SIGNS AND SYMPTOMS Hypertension does not usually cause signs or symptoms. Extremely high blood pressure (hypertensive crisis) may cause headache, anxiety, shortness of breath, and nosebleed. DIAGNOSIS  To check if you have hypertension, your health care provider will measure your blood pressure  while you are seated, with your arm held at the level of your heart. It should be measured at least twice using the same arm. Certain conditions can cause a difference in blood pressure between your right and left arms. A blood pressure reading that is higher than normal on one occasion does not mean that you need treatment. If one blood pressure reading is high, ask your health care provider about having it checked again. TREATMENT  Treating high blood pressure includes making lifestyle changes and possibly taking medicine. Living a healthy lifestyle can help lower high blood pressure. You may need to change some of your habits. Lifestyle changes may include: Following the DASH diet. This diet is high in fruits, vegetables, and whole grains. It is low in salt, red meat, and added sugars. Getting at least 2 hours of brisk physical activity every week. Losing weight if necessary. Not smoking. Limiting alcoholic beverages. Learning ways to reduce stress. If lifestyle changes are not enough to get your blood pressure under control, your health care provider may prescribe medicine. You may need to take more than one. Work closely with your health care provider to understand the risks and benefits. HOME CARE INSTRUCTIONS Have your blood pressure rechecked as directed by your health care provider.  Take medicines only as directed by your health care provider. Follow the directions carefully. Blood pressure medicines must be taken as prescribed. The medicine does not work as well when you skip doses. Skipping doses also puts you at risk for problems.  Do not smoke.  Monitor your blood pressure at home as directed by your health care provider. SEEK MEDICAL CARE IF:  You think you are having a reaction to medicines taken. You have recurrent headaches or feel dizzy. You have swelling in your ankles. You have trouble with your vision. SEEK IMMEDIATE MEDICAL CARE IF: You develop a severe headache or  confusion. You have unusual weakness, numbness, or feel faint. You have severe chest or abdominal pain. You vomit repeatedly. You have trouble breathing. MAKE SURE YOU:  Understand these instructions. Will watch your condition. Will get help right away if you are not doing well or get worse. Document Released: 07/17/2005 Document Revised: 12/01/2013 Document Reviewed: 05/09/2013 [facility] Patient Information 2015 [place], [place]. This information is not intended to replace advice given to you by your health care provider. Make sure you discuss any questions you have with your health care provider. well or get worse. Document Released: 10/13/2008 Document Revised: 07/22/2013 Document Reviewed: 10/13/2008 [facility] Patient Information 2015 [place], [place]. This information is not intended to replace advice given to you by your health care provider. Make sure you discuss any questions you have with your health care provider.   Will get help right away if you are not doing well or get worse. Document Released: 08/24/2004 Document Revised: 12/01/2013 Document Reviewed: 12/06/2009 [facility] Patient Information 2015 [place], [place]. This information is not intended to replace advice given to you by your health care provider. Make sure you discuss any  questions you have with your health care provider. ° °

## 2015-01-10 NOTE — ED Notes (Addendum)
Spoke to [place] from lab  Reports that they have received the wet prep.

## 2015-01-10 NOTE — ED Notes (Addendum)
Pt c/o excessive clear/yellow vaginal discharge with abd pain x 72 hours. Denies n/v, denies urinary s/s

## 2015-01-10 NOTE — ED Provider Notes (Signed)
CSN: [id number]     Arrival date & time 01/10/15  0447 History   First MD Initiated Contact with Patient 01/10/15 0555     Chief Complaint  Patient presents with  . Vaginal Discharge     (Consider location/radiation/quality/duration/timing/severity/associated sxs/prior Treatment) HPI  Pt is a 49yo female with hx of NIDDM and HTN, presenting to ED with c/o 3 day hx of gradually worsening vaginal itching and burning with associated clear-yellow discharge.   Pt states she met a Pharmacist, hospital and had protected intercourse but pt still concerned for STDs.  Denies fever, chills, n/v/d.  Pt states the itch of the rash is severe. She has been taking 2 showers a day and clearing her vaginal area 4 times a day.  Reports prior hx of yeast infection, but states she used Monistat and it seemed to go away, now pt concerned it came back even more severe.  No known allergies. LMP- 01/03/15  BP is elevated in triage. Pt states she is suppose to be taking HCTZ but has not got around to seeing her PCP.   Past Medical History  Diagnosis Date  . Diabetes mellitus   . Hypertension    Past Surgical History  Procedure Laterality Date  . Cesarean section     No family history on file. History  Substance Use Topics  . Smoking status: Never Smoker   . Smokeless tobacco: Not on file  . Alcohol Use: No   OB History    No data available     Review of Systems  Constitutional: Negative for fever, chills, diaphoresis, appetite change and fatigue.  Respiratory: Negative for cough and shortness of breath.   Cardiovascular: Negative for chest pain, palpitations and leg swelling.  Gastrointestinal: Negative for nausea, vomiting, abdominal pain and diarrhea.  Genitourinary: Positive for vaginal discharge ( clear) and vaginal pain ( itching burning rash). Negative for dysuria, urgency, frequency, hematuria, flank pain, decreased urine volume, vaginal bleeding, menstrual problem and pelvic pain.  Musculoskeletal: Negative  for myalgias and back pain.  All other systems reviewed and are negative.     Allergies  Pork-derived products  Home Medications   Prior to Admission medications   Medication Sig Start Date End Date Taking? Authorizing Provider  diphenhydrAMINE (BENADRYL) 25 MG tablet Take 1 tablet (25 mg total) by mouth every 6 (six) hours. 10/06/12  Yes Ezequiel Essex, MD  metFORMIN (GLUCOPHAGE) 500 MG tablet Take 500 mg by mouth 2 (two) times daily with a meal.   Yes Historical Provider, MD  Multiple Vitamin (MULITIVITAMIN WITH MINERALS) TABS Take 1 tablet by mouth daily.   Yes Historical Provider, MD  EPINEPHrine (EPIPEN) 0.3 mg/0.3 mL DEVI Inject 0.3 mLs (0.3 mg total) into the muscle as needed. 10/06/12   Ezequiel Essex, MD  hydrochlorothiazide (MICROZIDE) 12.5 MG capsule Take 1 capsule (12.5 mg total) by mouth daily. 01/10/15   Noland Fordyce, PA-C  nystatin cream (MYCOSTATIN) Apply to affected area 2 times daily 01/10/15   Noland Fordyce, PA-C  predniSONE (DELTASONE) 50 MG tablet 1 tablet PO daily Patient not taking: Reported on 01/10/2015 10/06/12   Ezequiel Essex, MD   BP 160/100 mmHg  Pulse 99  Temp(Src) 97.8 F (36.6 C) (Oral)  Resp 18  Ht _0  (1.702 m)  Wt 324 lb (146.965 kg)  BMI 50.73 kg/m2  SpO2 100%  LMP 01/03/2015 Physical Exam  Constitutional: She appears well-developed and well-nourished. No distress.  Morbidly obese female sitting in exam chair, NAD  HENT:  Head: Normocephalic  and atraumatic.  Eyes: Conjunctivae are normal. No scleral icterus.  Neck: Normal range of motion.  Cardiovascular: Normal rate, regular rhythm and normal heart sounds.   Pulmonary/Chest: Effort normal and breath sounds normal. No respiratory distress. She has no wheezes. She has no rales. She exhibits no tenderness.  Abdominal: Soft. Bowel sounds are normal. She exhibits no distension and no mass. There is no tenderness. There is no rebound and no guarding.  Genitourinary:  Chaperoned exam. External  genitalia-diffuse erythematous rash with thin white covering around bilateral groin and medial thighs c/w candidal rash.  Vaginal canal: small amount white-clear discharge. No bleeding or masses. No CMT, adnexal mass or tenderness.  Musculoskeletal: Normal range of motion.  Neurological: She is alert.  Skin: Skin is warm and dry. She is not diaphoretic.  Nursing note and vitals reviewed.   ED Course  Procedures (including critical care time) Labs Review Labs Reviewed  WET PREP, GENITAL - Abnormal; Notable for the following:    WBC, Wet Prep HPF POC TOO NUMEROUS TO COUNT (*)    All other components within normal limits  HIV ANTIBODY (ROUTINE TESTING)  RPR  URINALYSIS, ROUTINE W REFLEX MICROSCOPIC (NOT AT [facility])  POC URINE PREG, ED  GC/CHLAMYDIA PROBE AMP ([place]) NOT AT [facility]    Imaging Review No results found.   EKG Interpretation None      MDM   Final diagnoses:  Candidal vaginitis  Secondary hypertension, unspecified  History of diabetes mellitus    Pt is a 49yo female presenting to ED with c/o vaginal itching and irritation for 3 days after intercourse. Pt concerned for STDs but does report using a condom.  Exam c/w candidal rash involving both sides of groin and medial aspects of thighs. Pt is morbidly obese and diabetic.  Wet prep: significant for TNTC WBC.   BP elevated, pt states he is not on BP medications as she has not gotten around to seeing her PCP.  Rx: HCTZ and nystatin cream.  STDs labs sent off. Advised pt results should come back within 1 week.  Return precautions provided. Pt verbalized understanding and agreement with tx plan.     Noland Fordyce, PA-C 01/10/15 1443  Julianne Rice, MD 01/19/15 [phone]

## 2015-01-10 NOTE — ED Notes (Signed)
Pt states she met a Pharmacist, hospital and she believes he gave her the gift that keeps on giving,  Says she showers three times a day and takes care of her [place] area four times a day and she itches, burns and has a foul smelling discharge.  Pt is alert and oriented in NAD.

## 2015-01-11 LAB — GC/CHLAMYDIA PROBE AMP (~~LOC~~) NOT AT ARMC
Chlamydia: NEGATIVE
Neisseria Gonorrhea: NEGATIVE

## 2015-02-09 ENCOUNTER — Ambulatory Visit: Payer: Self-pay | Admitting: Physician Assistant

## 2015-07-16 ENCOUNTER — Ambulatory Visit: Payer: Self-pay | Admitting: Family Medicine

## 2017-11-26 ENCOUNTER — Other Ambulatory Visit: Payer: Self-pay

## 2017-11-26 ENCOUNTER — Emergency Department (HOSPITAL_BASED_OUTPATIENT_CLINIC_OR_DEPARTMENT_OTHER)
Admission: EM | Admit: 2017-11-26 | Discharge: 2017-11-26 | Disposition: A | Discharge status: Home / Self Care | Payer: No Typology Code available for payment source | Attending: Emergency Medicine | Admitting: Emergency Medicine

## 2017-11-26 ENCOUNTER — Emergency Department (HOSPITAL_BASED_OUTPATIENT_CLINIC_OR_DEPARTMENT_OTHER): Payer: No Typology Code available for payment source

## 2017-11-26 ENCOUNTER — Encounter (HOSPITAL_BASED_OUTPATIENT_CLINIC_OR_DEPARTMENT_OTHER): Payer: Self-pay

## 2017-11-26 DIAGNOSIS — I10 Essential (primary) hypertension: Secondary | ICD-10-CM | POA: Insufficient documentation

## 2017-11-26 DIAGNOSIS — W19XXXA Unspecified fall, initial encounter: Secondary | ICD-10-CM

## 2017-11-26 DIAGNOSIS — E119 Type 2 diabetes mellitus without complications: Secondary | ICD-10-CM | POA: Diagnosis not present

## 2017-11-26 DIAGNOSIS — R52 Pain, unspecified: Secondary | ICD-10-CM | POA: Diagnosis present

## 2017-11-26 DIAGNOSIS — Z7984 Long term (current) use of oral hypoglycemic drugs: Secondary | ICD-10-CM | POA: Insufficient documentation

## 2017-11-26 DIAGNOSIS — M7918 Myalgia, other site: Secondary | ICD-10-CM | POA: Diagnosis not present

## 2017-11-26 DIAGNOSIS — W010XXA Fall on same level from slipping, tripping and stumbling without subsequent striking against object, initial encounter: Secondary | ICD-10-CM | POA: Insufficient documentation

## 2017-11-26 MED ORDER — ACETAMINOPHEN 325 MG PO TABS
650.0000 mg | ORAL_TABLET | Freq: Once | ORAL | Status: AC
  Administered 2017-11-26: 650 mg via ORAL
  Filled 2017-11-26: qty 2

## 2017-11-26 NOTE — Discharge Instructions (Signed)
Please read and follow all provided instructions.  Your diagnoses today include:  1. Musculoskeletal pain   2. Fall, initial encounter     Tests performed today include:  X-rays of the affected areas - do NOT show any broken bones  Vital signs. See below for your results today.   Medications prescribed:   None  Take any prescribed medications only as directed.  Home care instructions:   Follow any educational materials contained in this packet  Follow R.I.C.E. Protocol:  R - rest your injury   I  - use ice on injury without applying directly to skin  C - compress injury with bandage or splint  E - elevate the injury as much as possible  Follow-up instructions: Please follow-up with your primary care provider if you continue to have significant pain in 1 week. In this case you may have a more severe injury that requires further care.   Return instructions:   Please return if your toes or feet are numb or tingling, appear gray or blue, or you have severe pain (also elevate the leg and loosen splint or wrap if you were given one)  Please return to the Emergency Department if you experience worsening symptoms.   Please return if you have any other emergent concerns.  Additional Information:  Your vital signs today were: BP (!) 140/94 (BP Location: Right Arm)    Pulse (!) 107    Temp 98.2 F (36.8 C) (Oral)    Resp 20    Ht  (1.676 m)    Wt 136.1 kg (300 lb)    LMP 01/03/2015    SpO2 100%    BMI 48.42 kg/m  If your blood pressure (BP) was elevated above 135/85 this visit, please have this repeated by your doctor within one month. --------------

## 2017-11-26 NOTE — ED Triage Notes (Signed)
Pt states she fell at work at 237pm-pain to right knee, right hip, right thigh, right breast and right hand-NAD-presents to triage in w/c

## 2017-11-26 NOTE — ED Notes (Addendum)
Pt states her shoe got caught in [employer] at school  And she feel on rt side rt elbow is painful and rt knee hip and leg are sore, pt has good neuro, good pulses and feeling to rt hand arm and rt foot < 3 sec blanch

## 2017-11-26 NOTE — ED Provider Notes (Signed)
[facility] EMERGENCY DEPARTMENT Provider Note   CSN: [id number] Arrival date & time: 11/26/17  1547     History   Chief Complaint Chief Complaint  Patient presents with  . Fall    HPI [name] is a 52 y.o. female.  Patient presents with complaint of acute onset right upper arm, right elbow, right hip, right knee pain which started after she caught her shoe in flooring and fell directly onto her right side.  She did not hit her head or lose consciousness.  She denies any neck pain, back pain, chest pain, abdominal pain.  Pain is worse with movement and palpation.  No treatments prior to arrival.  Nothing makes symptoms better.  The onset of this condition was acute. The course is constant. Aggravating factors: none. Alleviating factors: none.       Past Medical History:  Diagnosis Date  . Diabetes mellitus   . Hypertension     There are no active problems to display for this patient.   Past Surgical History:  Procedure Laterality Date  . CESAREAN SECTION       OB History   None      Home Medications    Prior to Admission medications   Medication Sig Start Date End Date Taking? Authorizing Provider  diphenhydrAMINE (BENADRYL) 25 MG tablet Take 1 tablet (25 mg total) by mouth every 6 (six) hours. 10/06/12   Rancour, Jeannett Senior, MD  EPINEPHrine (EPIPEN) 0.3 mg/0.3 mL DEVI Inject 0.3 mLs (0.3 mg total) into the muscle as needed. 10/06/12   Rancour, Jeannett Senior, MD  hydrochlorothiazide (MICROZIDE) 12.5 MG capsule Take 1 capsule (12.5 mg total) by mouth daily. 01/10/15   Lurene Shadow, PA-C  metFORMIN (GLUCOPHAGE) 500 MG tablet Take 500 mg by mouth 2 (two) times daily with a meal.    [provider]  Multiple Vitamin (MULITIVITAMIN WITH MINERALS) TABS Take 1 tablet by mouth daily.    [provider]  nystatin cream (MYCOSTATIN) Apply to affected area 2 times daily 01/10/15   Lurene Shadow, PA-C  predniSONE (DELTASONE) 50 MG tablet 1 tablet PO  daily Patient not taking: Reported on 01/10/2015 10/06/12   Glynn Octave, MD    Family History No family history on file.  Social History Social History   Tobacco Use  . Smoking status: Never Smoker  . Smokeless tobacco: Never Used  Substance Use Topics  . Alcohol use: No  . Drug use: No     Allergies   Pork-derived products   Review of Systems Review of Systems  Constitutional: Negative for activity change.  Musculoskeletal: Positive for arthralgias and myalgias. Negative for back pain, joint swelling and neck pain.  Skin: Negative for wound.  Neurological: Negative for weakness and numbness.     Physical Exam Updated Vital Signs BP (!) 140/94 (BP Location: Right Arm)   Pulse (!) 107   Temp 98.2 F (36.8 C) (Oral)   Resp 20   Ht  (1.676 m)   Wt 136.1 kg (300 lb)   LMP 01/03/2015   SpO2 100%   BMI 48.42 kg/m   Physical Exam  Constitutional: She appears well-developed and well-nourished.  HENT:  Head: Normocephalic and atraumatic.  Mouth/Throat: Oropharynx is clear and moist.  Eyes: Pupils are equal, round, and reactive to light.  Neck: Normal range of motion. Neck supple.  Cardiovascular: Normal pulses. Exam reveals no decreased pulses.  Musculoskeletal: She exhibits tenderness. She exhibits no edema.  Right shoulder: She exhibits tenderness. She exhibits normal range of motion and no bony tenderness.       Left shoulder: Normal.       Right elbow: She exhibits normal range of motion and no swelling. Tenderness found.       Left elbow: Normal.       Right hip: She exhibits tenderness. She exhibits normal range of motion, no bony tenderness and no swelling.       Left hip: Normal.       Right knee: She exhibits normal range of motion, no swelling and no effusion. Tenderness found.       Left knee: Normal.       Right ankle: Normal. No tenderness.       Cervical back: Normal. She exhibits normal range of motion, no tenderness and no bony  tenderness.       Right upper arm: She exhibits tenderness. She exhibits no bony tenderness, no swelling and no edema.       Right upper leg: Normal. She exhibits no tenderness, no bony tenderness and no swelling.       Right lower leg: Normal.       Right foot: Normal.  Neurological: She is alert. No sensory deficit.  Motor, sensation, and vascular distal to the injury is fully intact.   Skin: Skin is warm and dry.  Psychiatric: She has a normal mood and affect.  Nursing note and vitals reviewed.    ED Treatments / Results  Labs (all labs ordered are listed, but only abnormal results are displayed) Labs Reviewed - No data to display  EKG None  Radiology Dg Knee Complete 4 Views Right  Result Date: 11/26/2017 CLINICAL DATA:  Pt states she fell this afternoon and injured her right knee. C/o anterior pain. EXAM: RIGHT KNEE - COMPLETE 4+ VIEW COMPARISON:  None. FINDINGS: Mild narrowing of the articular cartilage in the medial compartment. Marginal spurring about all 3 compartments. Negative for fracture or dislocation. No effusion. IMPRESSION: 1. Negative for fracture or dislocation. 2. Tricompartmental DJD most marked medially. Electronically Signed   By: Corlis Leak M.D.   On: 11/26/2017 18:37   Dg Humerus Right  Result Date: 11/26/2017 CLINICAL DATA:  Fall, pain EXAM: RIGHT HUMERUS - 2+ VIEW COMPARISON:  None. FINDINGS: There is no evidence of fracture or other focal bone lesions. Soft tissues are unremarkable. IMPRESSION: Negative. Electronically Signed   By: Awilda Metro M.D.   On: 11/26/2017 18:39   Dg Hip Unilat W Or Wo Pelvis 2-3 Views Right  Result Date: 11/26/2017 CLINICAL DATA:  Fall, pain. EXAM: DG HIP (WITH OR WITHOUT PELVIS) 2-3V RIGHT COMPARISON:  None. FINDINGS: There is no evidence of hip fracture or dislocation. Mild bilateral hip osteoarthrosis. No destructive bony lesions. Mild sacroiliac osteoarthrosis. Phleboliths projects in the pelvis. IMPRESSION: No acute  osseous process. Electronically Signed   By: Awilda Metro M.D.   On: 11/26/2017 18:40    Procedures Procedures (including critical care time)  Medications Ordered in ED Medications  acetaminophen (TYLENOL) tablet 650 mg (650 mg Oral Given 11/26/17 1832)     Initial Impression / Assessment and Plan / ED Course  I have reviewed the triage vital signs and the nursing notes.  Pertinent labs & imaging results that were available during my care of the patient were reviewed by me and considered in my medical decision making (see chart for details).     Patient seen and examined. Work-up initiated. Medications ordered.  Vital signs reviewed and are as follows: BP (!) 140/94 (BP Location: Right Arm)   Pulse (!) 107   Temp 98.2 F (36.8 C) (Oral)   Resp 20   Ht  (1.676 m)   Wt 136.1 kg (300 lb)   LMP 01/03/2015   SpO2 100%   BMI 48.42 kg/m   Patient updated on x-ray results.  She will use Tylenol and rice protocol for treatment at home.  Encourage PCP follow-up with any persistent pains in 1 week.  Final Clinical Impressions(s) / ED Diagnoses   Final diagnoses:  Musculoskeletal pain  Fall, initial encounter   Patient with multiple contusions, negative imaging after fall today.  No head or neck injury suspected.  Conservative measures indicated at this time with PCP follow-up as needed.  Extremities are neurovascularly intact  ED Discharge Orders    None       Renne Crigler, Cordelia Poche 11/26/17 1919    Terrilee Files, MD 11/28/17 1743

## 2020-07-30 ENCOUNTER — Emergency Department (HOSPITAL_BASED_OUTPATIENT_CLINIC_OR_DEPARTMENT_OTHER)
Admission: EM | Admit: 2020-07-30 | Discharge: 2020-07-31 | Disposition: A | Discharge status: Home / Self Care | Payer: Medicaid Other | Source: Home / Self Care | Attending: Emergency Medicine | Admitting: Emergency Medicine

## 2020-07-30 ENCOUNTER — Other Ambulatory Visit: Payer: Self-pay

## 2020-07-30 ENCOUNTER — Encounter (HOSPITAL_BASED_OUTPATIENT_CLINIC_OR_DEPARTMENT_OTHER): Payer: Self-pay | Admitting: *Deleted

## 2020-07-30 DIAGNOSIS — E119 Type 2 diabetes mellitus without complications: Secondary | ICD-10-CM | POA: Diagnosis not present

## 2020-07-30 DIAGNOSIS — K2100 Gastro-esophageal reflux disease with esophagitis, without bleeding: Secondary | ICD-10-CM | POA: Diagnosis not present

## 2020-07-30 DIAGNOSIS — I10 Essential (primary) hypertension: Secondary | ICD-10-CM | POA: Insufficient documentation

## 2020-07-30 DIAGNOSIS — K2950 Unspecified chronic gastritis without bleeding: Secondary | ICD-10-CM | POA: Diagnosis not present

## 2020-07-30 DIAGNOSIS — H1013 Acute atopic conjunctivitis, bilateral: Secondary | ICD-10-CM | POA: Insufficient documentation

## 2020-07-30 DIAGNOSIS — J3081 Allergic rhinitis due to animal (cat) (dog) hair and dander: Secondary | ICD-10-CM | POA: Insufficient documentation

## 2020-07-30 DIAGNOSIS — K822 Perforation of gallbladder: Secondary | ICD-10-CM | POA: Diagnosis not present

## 2020-07-30 DIAGNOSIS — Z20822 Contact with and (suspected) exposure to covid-19: Secondary | ICD-10-CM | POA: Diagnosis not present

## 2020-07-30 DIAGNOSIS — K2289 Other specified disease of esophagus: Secondary | ICD-10-CM | POA: Diagnosis not present

## 2020-07-30 DIAGNOSIS — M25552 Pain in left hip: Secondary | ICD-10-CM | POA: Diagnosis present

## 2020-07-30 DIAGNOSIS — Z79899 Other long term (current) drug therapy: Secondary | ICD-10-CM | POA: Insufficient documentation

## 2020-07-30 DIAGNOSIS — Z7984 Long term (current) use of oral hypoglycemic drugs: Secondary | ICD-10-CM | POA: Insufficient documentation

## 2020-07-30 DIAGNOSIS — H11423 Conjunctival edema, bilateral: Secondary | ICD-10-CM | POA: Insufficient documentation

## 2020-07-30 DIAGNOSIS — K920 Hematemesis: Secondary | ICD-10-CM | POA: Diagnosis not present

## 2020-07-30 NOTE — ED Triage Notes (Signed)
Pt reports that her new dog spit in her eyes tonight while playing. Denies scratch or bite. Pt is noted to have bilateral red eyes with swollen lids.  Left eye is noted to have a 'bubble' on the lateral eye.  Pt does reports cloudy vision. Benadryl taken PTA. No known previous dog allergy.

## 2020-07-31 MED ORDER — NAPHAZOLINE-PHENIRAMINE 0.025-0.3 % OP SOLN: 2.0000 [drp] | OPHTHALMIC | 0 refills | Status: AC | PRN

## 2020-07-31 MED ORDER — CETIRIZINE HCL 10 MG PO TABS: ORAL_TABLET | ORAL | 0 refills | Status: AC

## 2020-07-31 NOTE — ED Provider Notes (Signed)
[facility] Provider Note: Lowella Dell, MD, FACEP  CSN: [id number] MRN: [id number] ARRIVAL: 07/30/20 at 2308 ROOM: [facility]   CHIEF COMPLAINT  Eye Injury   HISTORY OF PRESENT ILLNESS  07/31/20 12:00 AM [name] is a 55 y.o. female whose new dog spit in her eyes yesterday evening while playing.  She was not scratched or bite.  She now has red eyes bilaterally with swollen lids and there is a "bubble" on the surface of the lateral aspect of the eye.  Her vision is moderately blurry.  She had itching of the eyes before she took 25 mg of Benadryl about 9:24 PM which relieved her itching but the swelling continues.  She denies any systemic symptoms such as nausea, vomiting or diarrhea.  She is not having any difficulty breathing.   Past Medical History:  Diagnosis Date  . Diabetes mellitus   . Hypertension     Past Surgical History:  Procedure Laterality Date  . CESAREAN SECTION      History reviewed. No pertinent family history.  Social History   Tobacco Use  . Smoking status: Never Smoker  . Smokeless tobacco: Never Used  Substance Use Topics  . Alcohol use: No  . Drug use: No    Prior to Admission medications   Medication Sig Start Date End Date Taking? Authorizing Provider  diphenhydrAMINE (BENADRYL) 25 MG tablet Take 1 tablet (25 mg total) by mouth every 6 (six) hours. 10/06/12   Rancour, Jeannett Senior, MD  EPINEPHrine (EPIPEN) 0.3 mg/0.3 mL DEVI Inject 0.3 mLs (0.3 mg total) into the muscle as needed. 10/06/12   Rancour, Jeannett Senior, MD  hydrochlorothiazide (MICROZIDE) 12.5 MG capsule Take 1 capsule (12.5 mg total) by mouth daily. 01/10/15   Lurene Shadow, PA-C  metFORMIN (GLUCOPHAGE) 500 MG tablet Take 500 mg by mouth 2 (two) times daily with a meal.    [provider]  Multiple Vitamin (MULITIVITAMIN WITH MINERALS) TABS Take 1 tablet by mouth daily.    [provider]  nystatin cream (MYCOSTATIN) Apply to affected area 2 times daily  01/10/15   Lurene Shadow, PA-C    Allergies Pork-derived products   REVIEW OF SYSTEMS  Negative except as noted here or in the History of Present Illness.   PHYSICAL EXAMINATION  Initial Vital Signs Blood pressure (!) 124/96, pulse (!) 110, temperature 98.3 F (36.8 C), temperature source Oral, resp. rate 18, height 5\' 7"  (1.702 m), weight (!) 151.5 kg, last menstrual period 01/03/2015, SpO2 97 %.  Examination General: Well-developed, well-nourished female in no acute distress; appearance consistent with age of record HENT: normocephalic; atraumatic Eyes: pupils equal, round and reactive to light; extraocular muscles intact; bilateral conjunctival injection, mild chemosis (primarily of the left lateral bulbar conjunctiva) and edema of the eyelids:    Neck: supple Heart: regular rate and rhythm; no murmurs, rubs or gallops Lungs: clear to auscultation bilaterally Abdomen: soft; nondistended; nontender; bowel sounds present Extremities: No deformity; full range of motion Neurologic: Awake, alert and oriented; motor function intact in all extremities and symmetric; no facial droop Skin: Warm and dry Psychiatric: Normal mood and affect   RESULTS  Summary of this visit's results, reviewed and interpreted by myself:   EKG Interpretation  Date/Time:    Ventricular Rate:    PR Interval:    QRS Duration:   QT Interval:    QTC Calculation:   R Axis:     Text Interpretation:        Laboratory Studies:  No results found for this or any previous visit (from the past 24 hour(s)). Imaging Studies: No results found.  ED COURSE and MDM  Nursing notes, initial and subsequent vitals signs, including pulse oximetry, reviewed and interpreted by myself.  Vitals:   07/30/20 2317 07/30/20 2318  BP: (!) 124/96   Pulse: (!) 110   Resp: 18   Temp: 98.3 F (36.8 C)   TempSrc: Oral   SpO2: 97%   Weight:  (!) 151.5 kg  Height:  5\' 7"  (1.702 m)   Medications - No data to  display  We will have the patient use Naphcon-A topically as well as antihistamine systemically.  She was advised she may not be able to tolerate this particular dog.   PROCEDURES  Procedures   ED DIAGNOSES     ICD-10-CM   1. 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 Acute allergic conjunctivitis of both eyes  H10.13        Zelie Asbill, MD 07/31/20 [phone]

## 2020-07-31 NOTE — ED Notes (Signed)
Pt discharged to home. Discharge instructions have been discussed with patient and/or family members. Pt verbally acknowledges understanding d/c instructions, and endorses comprehension to checkout at registration before leaving.  °

## 2020-08-01 ENCOUNTER — Encounter (HOSPITAL_COMMUNITY): Payer: Self-pay | Admitting: Emergency Medicine

## 2020-08-01 ENCOUNTER — Other Ambulatory Visit: Payer: Self-pay

## 2020-08-01 ENCOUNTER — Observation Stay (HOSPITAL_COMMUNITY)
Admission: EM | Admit: 2020-08-01 | Discharge: 2020-08-02 | Disposition: A | Discharge status: Home / Self Care | Payer: Medicaid Other | Attending: Internal Medicine | Admitting: Internal Medicine

## 2020-08-01 ENCOUNTER — Emergency Department (HOSPITAL_COMMUNITY): Payer: Medicaid Other

## 2020-08-01 DIAGNOSIS — M25552 Pain in left hip: Principal | ICD-10-CM

## 2020-08-01 DIAGNOSIS — Z79899 Other long term (current) drug therapy: Secondary | ICD-10-CM | POA: Insufficient documentation

## 2020-08-01 DIAGNOSIS — E119 Type 2 diabetes mellitus without complications: Secondary | ICD-10-CM | POA: Insufficient documentation

## 2020-08-01 DIAGNOSIS — I10 Essential (primary) hypertension: Secondary | ICD-10-CM | POA: Diagnosis not present

## 2020-08-01 DIAGNOSIS — K920 Hematemesis: Secondary | ICD-10-CM | POA: Insufficient documentation

## 2020-08-01 DIAGNOSIS — E1169 Type 2 diabetes mellitus with other specified complication: Secondary | ICD-10-CM | POA: Diagnosis not present

## 2020-08-01 DIAGNOSIS — K2100 Gastro-esophageal reflux disease with esophagitis, without bleeding: Secondary | ICD-10-CM | POA: Insufficient documentation

## 2020-08-01 DIAGNOSIS — K2289 Other specified disease of esophagus: Secondary | ICD-10-CM | POA: Insufficient documentation

## 2020-08-01 DIAGNOSIS — E876 Hypokalemia: Secondary | ICD-10-CM | POA: Diagnosis present

## 2020-08-01 DIAGNOSIS — M24652 Ankylosis, left hip: Secondary | ICD-10-CM | POA: Diagnosis present

## 2020-08-01 DIAGNOSIS — K922 Gastrointestinal hemorrhage, unspecified: Secondary | ICD-10-CM | POA: Diagnosis not present

## 2020-08-01 DIAGNOSIS — Z20822 Contact with and (suspected) exposure to covid-19: Secondary | ICD-10-CM | POA: Insufficient documentation

## 2020-08-01 DIAGNOSIS — K2950 Unspecified chronic gastritis without bleeding: Secondary | ICD-10-CM | POA: Insufficient documentation

## 2020-08-01 DIAGNOSIS — M8538 Osteitis condensans, other site: Secondary | ICD-10-CM

## 2020-08-01 DIAGNOSIS — K822 Perforation of gallbladder: Secondary | ICD-10-CM | POA: Insufficient documentation

## 2020-08-01 LAB — GLUCOSE, CAPILLARY
Glucose-Capillary: 171 mg/dL — ABNORMAL HIGH (ref 70–99)
Glucose-Capillary: 106 mg/dL — ABNORMAL HIGH (ref 70–99)
Glucose-Capillary: 149 mg/dL — ABNORMAL HIGH (ref 70–99)

## 2020-08-01 LAB — COMPREHENSIVE METABOLIC PANEL
ALT: 15 U/L (ref 0–44)
AST: 17 U/L (ref 15–41)
Albumin: 3.5 g/dL (ref 3.5–5.0)
Alkaline Phosphatase: 89 U/L (ref 38–126)
Anion gap: 13 (ref 5–15)
BUN: 14 mg/dL (ref 6–20)
CO2: 25 mmol/L (ref 22–32)
Calcium: 9.1 mg/dL (ref 8.9–10.3)
Chloride: 100 mmol/L (ref 98–111)
Creatinine, Ser: 0.88 mg/dL (ref 0.44–1.00)
GFR, Estimated: >60 mL/min (ref >60)
Glucose, Bld: 231 mg/dL — ABNORMAL HIGH (ref 70–99)
Potassium: 3.4 mmol/L — ABNORMAL LOW (ref 3.5–5.1)
Sodium: 138 mmol/L (ref 135–145)
Total Bilirubin: 0.9 mg/dL (ref 0.3–1.2)
Total Protein: 7.9 g/dL (ref 6.5–8.1)

## 2020-08-01 LAB — CBC
HCT: 37.5 % (ref 36.0–46.0)
Hemoglobin: 11.8 g/dL — ABNORMAL LOW (ref 12.0–15.0)
MCH: 28.2 pg (ref 26.0–34.0)
MCHC: 31.5 g/dL (ref 30.0–36.0)
MCV: 89.7 fL (ref 80.0–100.0)
Platelets: 266 10*3/uL (ref 150–400)
RBC: 4.18 MIL/uL (ref 3.87–5.11)
RDW: 14.6 % (ref 11.5–15.5)
WBC: 12 10*3/uL — ABNORMAL HIGH (ref 4.0–10.5)
nRBC: 0 % (ref 0.0–0.2)

## 2020-08-01 LAB — RESP PANEL BY RT-PCR (FLU A&B, COVID) ARPGX2
Influenza A by PCR: NEGATIVE
Influenza B by PCR: NEGATIVE
SARS Coronavirus 2 by RT PCR: NEGATIVE

## 2020-08-01 LAB — TYPE AND SCREEN
ABO/RH(D): A POS
Antibody Screen: NEGATIVE

## 2020-08-01 LAB — URINALYSIS, ROUTINE W REFLEX MICROSCOPIC
Bilirubin Urine: NEGATIVE
Glucose, UA: 50 mg/dL — AB
Hgb urine dipstick: NEGATIVE
Ketones, ur: 5 mg/dL — AB
Leukocytes,Ua: NEGATIVE
Nitrite: NEGATIVE
Protein, ur: NEGATIVE mg/dL
Specific Gravity, Urine: 1.033 — ABNORMAL HIGH (ref 1.005–1.030)
pH: 6 (ref 5.0–8.0)

## 2020-08-01 LAB — I-STAT BETA HCG BLOOD, ED (MC, WL, AP ONLY): I-stat hCG, quantitative: <5 m[IU]/mL (ref <5)

## 2020-08-01 LAB — OCCULT BLOOD GASTRIC / DUODENUM (SPECIMEN CUP)
Occult Blood, Gastric: POSITIVE — AB
pH, Gastric: 4

## 2020-08-01 LAB — HEMOGLOBIN A1C
Hgb A1c MFr Bld: 9.2 % — ABNORMAL HIGH (ref 4.8–5.6)
Mean Plasma Glucose: 217.34 mg/dL

## 2020-08-01 LAB — HIV ANTIBODY (ROUTINE TESTING W REFLEX): HIV Screen 4th Generation wRfx: NONREACTIVE

## 2020-08-01 LAB — LIPASE, BLOOD: Lipase: 41 U/L (ref 11–51)

## 2020-08-01 LAB — ABO/RH: ABO/RH(D): A POS

## 2020-08-01 MED ORDER — HYDROMORPHONE HCL 1 MG/ML IJ SOLN
1.0000 mg | Freq: Once | INTRAMUSCULAR | Status: AC
  Administered 2020-08-01: 1 mg via INTRAVENOUS
  Filled 2020-08-01: qty 1

## 2020-08-01 MED ORDER — ACETAMINOPHEN 325 MG PO TABS: 650.0000 mg | ORAL_TABLET | Freq: Four times a day (QID) | ORAL | Status: DC | PRN

## 2020-08-01 MED ORDER — METHOCARBAMOL 1000 MG/10ML IJ SOLN
500.0000 mg | Freq: Four times a day (QID) | INTRAVENOUS | Status: DC | PRN
  Filled 2020-08-01: qty 5

## 2020-08-01 MED ORDER — POLYETHYLENE GLYCOL 3350 17 G PO PACK: 17.0000 g | PACK | Freq: Every day | ORAL | Status: DC | PRN

## 2020-08-01 MED ORDER — PANTOPRAZOLE SODIUM 40 MG IV SOLR
40.0000 mg | Freq: Two times a day (BID) | INTRAVENOUS | Status: DC
  Administered 2020-08-01: 40 mg via INTRAVENOUS
  Filled 2020-08-01: qty 40

## 2020-08-01 MED ORDER — INSULIN ASPART 100 UNIT/ML ~~LOC~~ SOLN: 0.0000 [IU] | Freq: Every day | SUBCUTANEOUS | Status: DC

## 2020-08-01 MED ORDER — ONDANSETRON HCL 4 MG/2ML IJ SOLN: 4.0000 mg | Freq: Once | INTRAMUSCULAR | Status: DC | PRN

## 2020-08-01 MED ORDER — SODIUM CHLORIDE 0.9 % IV BOLUS (SEPSIS)
1000.0000 mL | Freq: Once | INTRAVENOUS | Status: AC
  Administered 2020-08-01: 1000 mL via INTRAVENOUS

## 2020-08-01 MED ORDER — PANTOPRAZOLE SODIUM 40 MG IV SOLR
40.0000 mg | Freq: Once | INTRAVENOUS | Status: AC
  Administered 2020-08-01: 40 mg via INTRAVENOUS
  Filled 2020-08-01: qty 40

## 2020-08-01 MED ORDER — NAPHAZOLINE-PHENIRAMINE 0.025-0.3 % OP SOLN: 2.0000 [drp] | OPHTHALMIC | Status: DC | PRN

## 2020-08-01 MED ORDER — AMLODIPINE BESYLATE 5 MG PO TABS
5.0000 mg | ORAL_TABLET | Freq: Every day | ORAL | Status: DC
  Administered 2020-08-01 – 2020-08-02 (×2): 5 mg via ORAL
  Filled 2020-08-01 (×2): qty 1

## 2020-08-01 MED ORDER — ONDANSETRON HCL 4 MG/2ML IJ SOLN: 4.0000 mg | Freq: Four times a day (QID) | INTRAMUSCULAR | Status: DC | PRN

## 2020-08-01 MED ORDER — METOPROLOL TARTRATE 5 MG/5ML IV SOLN: 5.0000 mg | Freq: Four times a day (QID) | INTRAVENOUS | Status: DC | PRN

## 2020-08-01 MED ORDER — ONDANSETRON HCL 4 MG/2ML IJ SOLN
4.0000 mg | Freq: Once | INTRAMUSCULAR | Status: AC
  Administered 2020-08-01: 4 mg via INTRAVENOUS
  Filled 2020-08-01: qty 2

## 2020-08-01 MED ORDER — INSULIN DETEMIR 100 UNIT/ML ~~LOC~~ SOLN
20.0000 [IU] | Freq: Every day | SUBCUTANEOUS | Status: DC
  Administered 2020-08-01 – 2020-08-02 (×2): 20 [IU] via SUBCUTANEOUS
  Filled 2020-08-01 (×2): qty 0.2

## 2020-08-01 MED ORDER — IOHEXOL 350 MG/ML SOLN
100.0000 mL | Freq: Once | INTRAVENOUS | Status: AC | PRN
  Administered 2020-08-01: 100 mL via INTRAVENOUS

## 2020-08-01 MED ORDER — ONDANSETRON HCL 4 MG PO TABS: 4.0000 mg | ORAL_TABLET | Freq: Four times a day (QID) | ORAL | Status: DC | PRN

## 2020-08-01 MED ORDER — INSULIN ASPART 100 UNIT/ML ~~LOC~~ SOLN
0.0000 [IU] | Freq: Three times a day (TID) | SUBCUTANEOUS | Status: DC
  Administered 2020-08-01 – 2020-08-02 (×2): 3 [IU] via SUBCUTANEOUS

## 2020-08-01 MED ORDER — HYDROMORPHONE HCL 1 MG/ML IJ SOLN: 0.5000 mg | INTRAMUSCULAR | Status: DC | PRN

## 2020-08-01 NOTE — ED Notes (Signed)
Pt drank a half cup of water and has not experienced any nausea yet.

## 2020-08-01 NOTE — Consult Note (Signed)
Referring Provider: Dr. Elesa Massed Primary Care Physician:  Default, Provider, MD Primary Gastroenterologist:  Gentry Fitz  Reason for Consultation:  Hematemesis  HPI: [name] is a 55 y.o. female with acute onset of profuse vomiting that started out pale brown and became dark brown "mocha" colored but denies black or red appearance. ER doctor witnessed 10 episodes of coffee grounds emesis. Gastroccult positive. Has been having LLQ pain and hip pain and takes Advil intermittently for that. Denies epigastric pain. Took 3 tablets of Advil yesterday and usually takes it 3 times per week. Denies previous vomiting like this. Has had chronic GERD for several years and does not take any meds for it. Denies alcohol. Hgb 11.8. Feels nauseous. No vomiting since 7AM. Nurse in room.  Past Medical History:  Diagnosis Date  . Diabetes mellitus   . Hypertension     Past Surgical History:  Procedure Laterality Date  . CESAREAN SECTION      Prior to Admission medications   Medication Sig Start Date End Date Taking? Authorizing Provider  atorvastatin (LIPITOR) 40 MG tablet Take 40 mg by mouth at bedtime. 05/12/20  Yes [provider]  B Complex Vitamins (B COMPLEX PO) Take 1 tablet by mouth daily.   Yes [provider]  citalopram (CELEXA) 40 MG tablet Take 40 mg by mouth daily. 06/29/20  Yes [provider]  hydrochlorothiazide (HYDRODIURIL) 12.5 MG tablet Take 12.5 mg by mouth daily. 06/29/20  Yes [provider]  ibuprofen (ADVIL) 200 MG tablet Take 400 mg by mouth every 6 (six) hours as needed for fever, headache or mild pain.   Yes [provider]  lisinopril (ZESTRIL) 10 MG tablet Take 10 mg by mouth daily. 07/14/20  Yes [provider]  OZEMPIC, 0.25 OR 0.5 MG/DOSE, 2 MG/1.5ML SOPN Inject 0.5 mLs into the skin once a week. 06/11/20  Yes [provider]  TOUJEO SOLOSTAR 300 UNIT/ML Solostar Pen Inject 35 Units into the skin daily. 06/23/20   Yes [provider]  cetirizine (ZYRTEC) 10 MG tablet Take 1 tablet daily as needed for allergies.  May take twice daily if necessary. 07/31/20   Molpus, John, MD  hydrochlorothiazide (MICROZIDE) 12.5 MG capsule Take 1 capsule (12.5 mg total) by mouth daily. Patient not taking: Reported on 08/01/2020 01/10/15   Lurene Shadow, PA-C  naphazoline-pheniramine (NAPHCON-A) 0.025-0.3 % ophthalmic solution Place 2 drops into both eyes every 4 (four) hours as needed for eye irritation or allergies. 07/31/20   Molpus, John, MD    Scheduled Meds: . insulin aspart  0-15 Units Subcutaneous TID WC  . insulin aspart  0-5 Units Subcutaneous QHS  . insulin detemir  20 Units Subcutaneous Daily  . pantoprazole (PROTONIX) IV  40 mg Intravenous Q12H   Continuous Infusions: . methocarbamol (ROBAXIN) IV     PRN Meds:.HYDROmorphone (DILAUDID) injection, methocarbamol (ROBAXIN) IV, metoprolol tartrate, naphazoline-pheniramine, ondansetron **OR** ondansetron (ZOFRAN) IV, polyethylene glycol  Allergies as of 08/01/2020 - Review Complete 08/01/2020  Allergen Reaction Noted  . Lactase Diarrhea 11/26/2019  . Pork-derived products Hives 01/10/2015    History reviewed. No pertinent family history.  Social History   Socioeconomic History  . Marital status: Single    Spouse name: Not on file  . Number of children: Not on file  . Years of education: Not on file  . Highest education level: Not on file  Occupational History  . Not on file  Tobacco Use  . Smoking status: Never Smoker  . Smokeless tobacco: Never Used  Substance and Sexual Activity  . Alcohol use: No  . Drug use: No  . Sexual activity: Not on file  Other Topics Concern  . Not on file  Social History Narrative  . Not on file   Social Determinants of Health   Financial Resource Strain: Not on file  Food Insecurity: Not on file  Transportation Needs: Not on file  Physical Activity: Not on file  Stress: Not on file  Social  Connections: Not on file  Intimate Partner Violence: Not on file    Review of Systems: All negative except as stated above in HPI.  Physical Exam: Vital signs: Vitals:   08/01/20 1100 08/01/20 1209  BP: 118/81 (!) 145/91  Pulse: 91 87  Resp: 14 20  Temp:  98.7 F (37.1 C)  SpO2: 98% 100%   Last BM Date: 08/01/20 General:   Alert, Obese, pleasant and cooperative in NAD Head: normocephalic, atraumatic Eyes: anicteric sclera ENT: oropharynx clear Neck: supple, nontender Lungs:  Clear throughout to auscultation.   No wheezes, crackles, or rhonchi. No acute distress. Heart:  Regular rate and rhythm; no murmurs, clicks, rubs,  or gallops. Abdomen: soft, nontender, nondistended, +BS, obese  Rectal:  Deferred Ext: no edema  GI:  Lab Results: Recent Labs    08/01/20 0247  WBC 12.0*  HGB 11.8*  HCT 37.5  PLT 266   BMET Recent Labs    08/01/20 0247  NA 138  K 3.4*  CL 100  CO2 25  GLUCOSE 231*  BUN 14  CREATININE 0.88  CALCIUM 9.1   LFT Recent Labs    08/01/20 0247  PROT 7.9  ALBUMIN 3.5  AST 17  ALT 15  ALKPHOS 89  BILITOT 0.9   PT/INR No results for input(s): LABPROT, INR in the last 72 hours.   Studies/Results: CT Hip Left Wo Contrast  Result Date: 08/01/2020 CLINICAL DATA:  Left hip pain EXAM: CT OF THE LEFT HIP WITHOUT CONTRAST TECHNIQUE: Multidetector CT imaging of the left hip was performed according to the standard protocol. Multiplanar CT image reconstructions were also generated. COMPARISON:  None. FINDINGS: Bones/Joint/Cartilage No fracture or dislocation of the left hip. There is ankylosis of the left sacroiliac joint. There is sclerotic change on both sides of the left sacroiliac joint, greater on the iliac bone. Ligaments Suboptimally assessed by CT. Muscles and Tendons Unremarkable. Soft tissues Visualized intraperitoneal soft tissues are unremarkable. IMPRESSION: 1. No acute fracture or dislocation of the left hip. 2. 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 Electronically Signed   By: Kevin  Herman M.D.   On: 08/01/2020 04:49   CT ANGIO CHEST AORTA W/CM & OR WO/CM  Result Date: 08/01/2020 CLINICAL DATA:  Aortic disease EXAM: CT ANGIOGRAPHY CHEST WITH CONTRAST TECHNIQUE: Multidetector CT imaging of the chest was performed using the standard protocol during bolus administration of intravenous contrast. Multiplanar CT image reconstructions and MIPs were obtained to evaluate the vascular anatomy. CONTRAST:  100mL OMNIPAQUE IOHEXOL 350 MG/ML SOLN COMPARISON:  None. FINDINGS: Cardiovascular: No aortic intramural hematoma. Preferential opacification of the thoracic aorta. No evidence of thoracic aortic aneurysm or dissection. Normal heart size. No pericardial effusion. Mediastinum/Nodes: No enlarged mediastinal, hilar, or axillary lymph nodes. Thyroid gland, trachea, and esophagus demonstrate no significant findings. Lungs/Pleura: Lungs are clear. No pleural effusion or pneumothorax. Upper Abdomen: No acute abnormality. Musculoskeletal: No chest wall abnormality. No acute or significant osseous findings. Review of the MIP images confirms the above   findings. IMPRESSION: No acute aortic syndrome or other acute thoracic abnormality. Electronically Signed   By: Deatra Robinson M.D.   On: 08/01/2020 04:42    Impression/Plan: Coffee grounds emesis in the setting of intermittent NSAID use. Hemodynamically stable. Anti-emetics prn. Continue Protonix IV Q 12 hours. EGD tomorrow morning to assess for peptic ulcer disease. Clear liquid diet today and NPO p MN. EGD scheduled for 9AM by Dr. Levora Angel.    LOS: 0 days   Shirley Friar  08/01/2020, 12:48 PM  Questions please call [phone]

## 2020-08-01 NOTE — Anesthesia Preprocedure Evaluation (Signed)
Anesthesia Evaluation  Patient identified by MRN, date of birth, ID band Patient awake    Reviewed: Allergy & Precautions, NPO status , Patient's Chart, lab work & pertinent test results  Airway Mallampati: II  TM Distance: >3 FB Neck ROM: Full    Dental  (+) Dental Advisory Given   Pulmonary neg pulmonary ROS,    Pulmonary exam normal breath sounds clear to auscultation       Cardiovascular hypertension, Pt. on medications Normal cardiovascular exam Rhythm:Regular Rate:Normal     Neuro/Psych negative neurological ROS     GI/Hepatic negative GI ROS, Neg liver ROS,   Endo/Other  diabetesMorbid obesity  Renal/GU negative Renal ROS     Musculoskeletal negative musculoskeletal ROS (+)   Abdominal (+) + obese,   Peds  Hematology negative hematology ROS (+)   Anesthesia Other Findings   Reproductive/Obstetrics                            Anesthesia Physical Anesthesia Plan  ASA: IV  Anesthesia Plan: MAC   Post-op Pain Management:    Induction: Intravenous  PONV Risk Score and Plan: 2 and Propofol infusion, Treatment may vary due to age or medical condition and TIVA  Airway Management Planned: Natural Airway  Additional Equipment:   Intra-op Plan:   Post-operative Plan:   Informed Consent: I have reviewed the patients History and Physical, chart, labs and discussed the procedure including the risks, benefits and alternatives for the proposed anesthesia with the patient or authorized representative who has indicated his/her understanding and acceptance.     Dental advisory given  Plan Discussed with: CRNA  Anesthesia Plan Comments:        Anesthesia Quick Evaluation

## 2020-08-01 NOTE — ED Notes (Signed)
Pt normally ambulates with assistance of a walking stick. She ambulated to the restroom and back with one staff assist with a limping gait.

## 2020-08-01 NOTE — H&P (Signed)
History and Physical        Hospital Admission Note Date: 08/01/2020  Patient name: [name] Medical record number: [id number] Date of birth: Jan 17, 1966 Age: 55 y.o. Gender: female  PCP: Default, Provider, MD    Chief Complaint    No chief complaint on file.     HPI:   This is a 55 year old female with past medical history of hypertension, diabetes with neuropathy who presented to the ED with acute onset left lower back, buttock and left lateral hip pain that started yesterday evening.  Yesterday the patient was playing with her dog during the day but felt fine.  In the evening she was sitting and felt some discomfort in her left low back and reached to get some medication and had acute onset severe pain which she had never had before.  Did not radiate down her leg.  Reported to the ED physician that she has also been exercising lately.  Soon after this episode of pain she began having intractable nausea and vomiting and was noted to have coffee-ground emesis by EMS.  States that since being in the ED her pain has improved but has not resolved with the IV pain meds.  She states that she has been taking about 600 mg of ibuprofen at least 3 times weekly since last August for general musculoskeletal discomfort.  Has not noticed any blood in her stool.  ED Course: Afebrile, hemodynamically stable, on room air. Notable Labs: Sodium 138, K3.4, glucose 231, BUN 14, creatinine 0.88, WBC 12.0, Hb 11.8, platelets 266, gastric occult blood positive, COVID-19 and flu negative. 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  CTA chest negative for dissection. Patient received Protonix, 1 L NS bolus and Dilaudid.    Vitals:   08/01/20 1209 08/01/20 1553  BP: (!) 145/91  126/76  Pulse: 87 90  Resp: 20 18  Temp: 98.7 F (37.1 C) 98.5 F (36.9 C)  SpO2: 100% 97%     Review of Systems:  Review of Systems  All other systems reviewed and are negative.   Medical/Social/Family History   Past Medical History: Past Medical History:  Diagnosis Date  . Diabetes mellitus   . Hypertension     Past Surgical History:  Procedure Laterality Date  . CESAREAN SECTION      Medications: Prior to Admission medications   Medication Sig Start Date End Date Taking? Authorizing Provider  atorvastatin (LIPITOR) 40 MG tablet Take 40 mg by mouth at bedtime. 05/12/20  Yes [provider]  B Complex Vitamins (B COMPLEX PO) Take 1 tablet by mouth daily.   Yes [provider]  citalopram (CELEXA) 40 MG tablet Take 40 mg by mouth daily. 06/29/20  Yes [provider]  hydrochlorothiazide (HYDRODIURIL) 12.5 MG tablet Take 12.5 mg by mouth daily. 06/29/20  Yes [provider]  ibuprofen (ADVIL) 200 MG tablet Take 400 mg by mouth every 6 (six) hours as needed for fever, headache or mild pain.   Yes [provider]  lisinopril (ZESTRIL) 10 MG tablet Take 10 mg by mouth daily. 07/14/20  Yes [provider]  OZEMPIC, 0.25 OR 0.5 MG/DOSE, 2 MG/1.5ML SOPN Inject 0.5 mLs into the skin once a week. 06/11/20  Yes [provider]  TOUJEO SOLOSTAR 300 UNIT/ML Solostar Pen Inject 35 Units into the skin daily. 06/23/20  Yes [provider]  cetirizine (ZYRTEC) 10 MG tablet Take 1 tablet daily as needed for allergies.  May take twice daily if necessary. 07/31/20   Molpus, John, MD  hydrochlorothiazide (MICROZIDE) 12.5 MG capsule Take 1 capsule (12.5 mg total) by mouth daily. Patient not taking: Reported on 08/01/2020 01/10/15   Lurene Shadow, PA-C  naphazoline-pheniramine (NAPHCON-A) 0.025-0.3 % ophthalmic solution Place 2 drops into both eyes every 4 (four) hours as needed for eye irritation or allergies. 07/31/20   Molpus,  Jonny Ruiz, MD    Allergies:   Allergies  Allergen Reactions  . Lactase Diarrhea    Other reaction(s): Cough (ALLERGY/intolerance)  . Pork-Derived Conservation officer, nature    Social History:  reports that she has never smoked. She has never used smokeless tobacco. She reports that she does not drink alcohol and does not use drugs.  Family History: History reviewed. No pertinent family history.   Objective   Physical Exam: Blood pressure 126/76, pulse 90, temperature 98.5 F (36.9 C), temperature source Oral, resp. rate 18, height 5\' 7"  (1.702 m), last menstrual period 01/03/2015, SpO2 97 %.  Physical Exam Vitals and nursing note reviewed.  Constitutional:      Appearance: Normal appearance. She is obese.  HENT:     Head: Normocephalic and atraumatic.  Eyes:     Conjunctiva/sclera: Conjunctivae normal.  Cardiovascular:     Rate and Rhythm: Normal rate and regular rhythm.  Pulmonary:     Effort: Pulmonary effort is normal.     Breath sounds: Normal breath sounds.  Abdominal:     General: Abdomen is flat.     Palpations: Abdomen is soft.  Musculoskeletal:       Back:  Skin:    Coloration: Skin is not jaundiced or pale.  Neurological:     Mental Status: She is alert. Mental status is at baseline.  Psychiatric:        Mood and Affect: Mood normal.        Behavior: Behavior normal.     LABS on Admission: I have personally reviewed all the labs and imaging below    Basic Metabolic Panel: Recent Labs  Lab 08/01/20 0247  NA 138  K 3.4*  CL 100  CO2 25  GLUCOSE 231*  BUN 14  CREATININE 0.88  CALCIUM 9.1   Liver Function Tests: Recent Labs  Lab 08/01/20 0247  AST 17  ALT 15  ALKPHOS 89  BILITOT 0.9  PROT 7.9  ALBUMIN 3.5   Recent Labs  Lab 08/01/20 0247  LIPASE 41   No results for input(s): AMMONIA in the last 168 hours. CBC: Recent Labs  Lab 08/01/20 0247  WBC 12.0*  HGB 11.8*  HCT 37.5  MCV 89.7  PLT 266   Cardiac Enzymes: No results for  input(s): CKTOTAL, CKMB, CKMBINDEX, TROPONINI in the last 168 hours. BNP: Invalid input(s): POCBNP CBG: Recent Labs  Lab 08/01/20 1212  GLUCAP 171*    Radiological Exams on Admission:  CT Hip Left Wo Contrast  Result Date: 08/01/2020 CLINICAL DATA:  Left hip pain EXAM: CT OF THE LEFT HIP WITHOUT CONTRAST TECHNIQUE: Multidetector CT imaging of the left hip was performed according to the standard protocol. Multiplanar CT image reconstructions were also generated. COMPARISON:  None.  FINDINGS: Bones/Joint/Cartilage No fracture or dislocation of the left hip. There is ankylosis of the left sacroiliac joint. There is sclerotic change on both sides of the left sacroiliac joint, greater on the iliac bone. Ligaments Suboptimally assessed by CT. Muscles and Tendons Unremarkable. Soft tissues Visualized intraperitoneal soft tissues are unremarkable. IMPRESSION: 1. No acute fracture or dislocation of the left hip. 2. 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 Electronically Signed   By: Deatra Robinson M.D.   On: 08/01/2020 04:49   CT ANGIO CHEST AORTA W/CM & OR WO/CM  Result Date: 08/01/2020 CLINICAL DATA:  Aortic disease EXAM: CT ANGIOGRAPHY CHEST WITH CONTRAST TECHNIQUE: Multidetector CT imaging of the chest was performed using the standard protocol during bolus administration of intravenous contrast. Multiplanar CT image reconstructions and MIPs were obtained to evaluate the vascular anatomy. CONTRAST:  OMNIPAQUE IOHEXOL 350 MG/ML SOLN COMPARISON:  None. FINDINGS: Cardiovascular: No aortic intramural hematoma. Preferential opacification of the thoracic aorta. No evidence of thoracic aortic aneurysm or dissection. Normal heart size. No pericardial effusion. Mediastinum/Nodes: No enlarged mediastinal, hilar, or axillary lymph nodes. Thyroid gland, trachea, and esophagus demonstrate no significant findings. Lungs/Pleura: Lungs are clear. No  pleural effusion or pneumothorax. Upper Abdomen: No acute abnormality. Musculoskeletal: No chest wall abnormality. No acute or significant osseous findings. Review of the MIP images confirms the above findings. IMPRESSION: No acute aortic syndrome or other acute thoracic abnormality. Electronically Signed   By: Deatra Robinson M.D.   On: 08/01/2020 04:42      EKG: Not done   A & P   Principal Problem:   Left hip pain Active Problems:   GI bleed   Essential hypertension   Type 2 diabetes mellitus with other specified complication (HCC)   1. Left low back/hip pain likely musculoskeletal a. Has been exercising recently and playing with her dog and recently reached to grab something indicating this may be a pulled muscle b. Can use Tylenol for mild to moderate pain and Dilaudid for severe pain.  Robaxin IV for muscle spasms c. K pad d. PT eval  2. Intractable nausea and vomiting with UGI bleed in the setting of NSAID use a. Hold NSAIDs b. GI consulted-clear liquid diet today, n.p.o. after midnight for EGD tomorrow c. Continue Protonix twice daily d. Antiemetics as needed  3. Diabetes a. Hold home meds b. Levemir 20 units daily with sliding scale  4. Hypertension a. Holding home lisinopril and HCTZ b. Start amlodipine and as needed Lopressor   DVT prophylaxis: SCDs   Code Status: Full Code  Diet: Clear liquid diet, n.p.o. after midnight Family Communication: Admission, patients condition and plan of care including tests being ordered have been discussed with the patient who indicates understanding and agrees with the plan and Code Status. Disposition Plan: The appropriate patient status for this patient is INPATIENT. Inpatient status is judged to be reasonable and necessary in order to provide the required intensity of service to ensure the patient's safety. The patient's presenting symptoms, physical exam findings, and initial radiographic and laboratory data in the context of  their chronic comorbidities is felt to place them at high risk for further clinical deterioration. Furthermore, it is not anticipated that the patient will be medically stable for discharge from the hospital within 2 midnights of admission. The following factors support the patient status of inpatient.   " The patient's presenting symptoms include intractable nausea and vomiting, musculoskeletal pain. " The worrisome physical  exam findings include tenderness to palpation of left hip. " The initial radiographic and laboratory data are worrisome because of positive gastric occult. " The chronic co-morbidities include diabetes.   * I certify that at the point of admission it is my clinical judgment that the patient will require inpatient hospital care spanning beyond 2 midnights from the point of admission due to high intensity of service, high risk for further deterioration and high frequency of surveillance required.*   Status is: Inpatient  Remains inpatient appropriate because:IV treatments appropriate due to intensity of illness or inability to take PO and Inpatient level of care appropriate due to severity of illness   Dispo: The patient is from: Home              Anticipated d/c is to: Home              Anticipated d/c date is: 2 days              Patient currently is not medically stable to d/c.    Consultants  . GI  Procedures  . None  Time Spent on Admission: 60 minutes    Harold Hedge, DO [facility]  08/01/2020, 4:40 PM

## 2020-08-01 NOTE — ED Notes (Addendum)
Report given to [facility], [phone]

## 2020-08-01 NOTE — ED Triage Notes (Addendum)
Pt arriving via [facility] from home with left hip pain. No reported injury. Pt states she has history of sciatica but she feels as if her hip is out of place. Pt had 3 episodes of emesis as she tried to move from her bed to the stretcher. Pt had 3 more episodes of vomiting after moving from EMS stretcher to hall bed.

## 2020-08-01 NOTE — H&P (View-Only) (Signed)
Referring Provider: Dr. Elesa Massed Primary Care Physician:  Default, Provider, MD Primary Gastroenterologist:  Gentry Fitz  Reason for Consultation:  Hematemesis  HPI: [name] is a 55 y.o. female with acute onset of profuse vomiting that started out pale brown and became dark brown "mocha" colored but denies black or red appearance. ER doctor witnessed 10 episodes of coffee grounds emesis. Gastroccult positive. Has been having LLQ pain and hip pain and takes Advil intermittently for that. Denies epigastric pain. Took 3 tablets of Advil yesterday and usually takes it 3 times per week. Denies previous vomiting like this. Has had chronic GERD for several years and does not take any meds for it. Denies alcohol. Hgb 11.8. Feels nauseous. No vomiting since 7AM. Nurse in room.  Past Medical History:  Diagnosis Date  . Diabetes mellitus   . Hypertension     Past Surgical History:  Procedure Laterality Date  . CESAREAN SECTION      Prior to Admission medications   Medication Sig Start Date End Date Taking? Authorizing Provider  atorvastatin (LIPITOR) 40 MG tablet Take 40 mg by mouth at bedtime. 05/12/20  Yes [provider]  B Complex Vitamins (B COMPLEX PO) Take 1 tablet by mouth daily.   Yes [provider]  citalopram (CELEXA) 40 MG tablet Take 40 mg by mouth daily. 06/29/20  Yes [provider]  hydrochlorothiazide (HYDRODIURIL) 12.5 MG tablet Take 12.5 mg by mouth daily. 06/29/20  Yes [provider]  ibuprofen (ADVIL) 200 MG tablet Take 400 mg by mouth every 6 (six) hours as needed for fever, headache or mild pain.   Yes [provider]  lisinopril (ZESTRIL) 10 MG tablet Take 10 mg by mouth daily. 07/14/20  Yes [provider]  OZEMPIC, 0.25 OR 0.5 MG/DOSE, 2 MG/1.5ML SOPN Inject 0.5 mLs into the skin once a week. 06/11/20  Yes [provider]  TOUJEO SOLOSTAR 300 UNIT/ML Solostar Pen Inject 35 Units into the skin daily. 06/23/20   Yes [provider]  cetirizine (ZYRTEC) 10 MG tablet Take 1 tablet daily as needed for allergies.  May take twice daily if necessary. 07/31/20   Molpus, John, MD  hydrochlorothiazide (MICROZIDE) 12.5 MG capsule Take 1 capsule (12.5 mg total) by mouth daily. Patient not taking: Reported on 08/01/2020 01/10/15   Lurene Shadow, PA-C  naphazoline-pheniramine (NAPHCON-A) 0.025-0.3 % ophthalmic solution Place 2 drops into both eyes every 4 (four) hours as needed for eye irritation or allergies. 07/31/20   Molpus, John, MD    Scheduled Meds: . insulin aspart  0-15 Units Subcutaneous TID WC  . insulin aspart  0-5 Units Subcutaneous QHS  . insulin detemir  20 Units Subcutaneous Daily  . pantoprazole (PROTONIX) IV  40 mg Intravenous Q12H   Continuous Infusions: . methocarbamol (ROBAXIN) IV     PRN Meds:.HYDROmorphone (DILAUDID) injection, methocarbamol (ROBAXIN) IV, metoprolol tartrate, naphazoline-pheniramine, ondansetron **OR** ondansetron (ZOFRAN) IV, polyethylene glycol  Allergies as of 08/01/2020 - Review Complete 08/01/2020  Allergen Reaction Noted  . Lactase Diarrhea 11/26/2019  . Pork-derived products Hives 01/10/2015    History reviewed. No pertinent family history.  Social History   Socioeconomic History  . Marital status: Single    Spouse name: Not on file  . Number of children: Not on file  . Years of education: Not on file  . Highest education level: Not on file  Occupational History  . Not on file  Tobacco Use  . Smoking status: Never Smoker  . Smokeless tobacco: Never Used  Substance and Sexual Activity  . Alcohol use: No  . Drug use: No  . Sexual activity: Not on file  Other Topics Concern  . Not on file  Social History Narrative  . Not on file   Social Determinants of Health   Financial Resource Strain: Not on file  Food Insecurity: Not on file  Transportation Needs: Not on file  Physical Activity: Not on file  Stress: Not on file  Social  Connections: Not on file  Intimate Partner Violence: Not on file    Review of Systems: All negative except as stated above in HPI.  Physical Exam: Vital signs: Vitals:   08/01/20 1100 08/01/20 1209  BP: 118/81 (!) 145/91  Pulse: 91 87  Resp: 14 20  Temp:  98.7 F (37.1 C)  SpO2: 98% 100%   Last BM Date: 08/01/20 General:   Alert, Obese, pleasant and cooperative in NAD Head: normocephalic, atraumatic Eyes: anicteric sclera ENT: oropharynx clear Neck: supple, nontender Lungs:  Clear throughout to auscultation.   No wheezes, crackles, or rhonchi. No acute distress. Heart:  Regular rate and rhythm; no murmurs, clicks, rubs,  or gallops. Abdomen: soft, nontender, nondistended, +BS, obese  Rectal:  Deferred Ext: no edema  GI:  Lab Results: Recent Labs    08/01/20 0247  WBC 12.0*  HGB 11.8*  HCT 37.5  PLT 266   BMET Recent Labs    08/01/20 0247  NA 138  K 3.4*  CL 100  CO2 25  GLUCOSE 231*  BUN 14  CREATININE 0.88  CALCIUM 9.1   LFT Recent Labs    08/01/20 0247  PROT 7.9  ALBUMIN 3.5  AST 17  ALT 15  ALKPHOS 89  BILITOT 0.9   PT/INR No results for input(s): LABPROT, INR in the last 72 hours.   Studies/Results: CT Hip Left Wo Contrast  Result Date: 08/01/2020 CLINICAL DATA:  Left hip pain EXAM: CT OF THE LEFT HIP WITHOUT CONTRAST TECHNIQUE: Multidetector CT imaging of the left hip was performed according to the standard protocol. Multiplanar CT image reconstructions were also generated. COMPARISON:  None. FINDINGS: Bones/Joint/Cartilage No fracture or dislocation of the left hip. There is ankylosis of the left sacroiliac joint. There is sclerotic change on both sides of the left sacroiliac joint, greater on the iliac bone. Ligaments Suboptimally assessed by CT. Muscles and Tendons Unremarkable. Soft tissues Visualized intraperitoneal soft tissues are unremarkable. IMPRESSION: 1. No acute fracture or dislocation of the left hip. 2. 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 Electronically Signed   By: Ulyses Jarred M.D.   On: 08/01/2020 04:49   CT ANGIO CHEST AORTA W/CM & OR WO/CM  Result Date: 08/01/2020 CLINICAL DATA:  Aortic disease EXAM: CT ANGIOGRAPHY CHEST WITH CONTRAST TECHNIQUE: Multidetector CT imaging of the chest was performed using the standard protocol during bolus administration of intravenous contrast. Multiplanar CT image reconstructions and MIPs were obtained to evaluate the vascular anatomy. CONTRAST:  188mL OMNIPAQUE IOHEXOL 350 MG/ML SOLN COMPARISON:  None. FINDINGS: Cardiovascular: No aortic intramural hematoma. Preferential opacification of the thoracic aorta. No evidence of thoracic aortic aneurysm or dissection. Normal heart size. No pericardial effusion. Mediastinum/Nodes: No enlarged mediastinal, hilar, or axillary lymph nodes. Thyroid gland, trachea, and esophagus demonstrate no significant findings. Lungs/Pleura: Lungs are clear. No pleural effusion or pneumothorax. Upper Abdomen: No acute abnormality. Musculoskeletal: No chest wall abnormality. No acute or significant osseous findings. Review of the MIP images confirms the above  findings. IMPRESSION: No acute aortic syndrome or other acute thoracic abnormality. Electronically Signed   By: Deatra Robinson M.D.   On: 08/01/2020 04:42    Impression/Plan: Coffee grounds emesis in the setting of intermittent NSAID use. Hemodynamically stable. Anti-emetics prn. Continue Protonix IV Q 12 hours. EGD tomorrow morning to assess for peptic ulcer disease. Clear liquid diet today and NPO p MN. EGD scheduled for 9AM by Dr. Levora Angel.    LOS: 0 days   Shirley Friar  08/01/2020, 12:48 PM  Questions please call [phone]

## 2020-08-01 NOTE — ED Notes (Signed)
Patient transported to CT 

## 2020-08-01 NOTE — ED Provider Notes (Signed)
TIME SEEN: 3:17 AM  CHIEF COMPLAINT: left back and left hip pain  HPI: Patient is a 55 year old female with history of hypertension, non-insulin-dependent diabetes who presents to the emergency department with sudden onset left lower back, left buttock and left lateral hip pain that started at 11:23 PM last night.  Describes it as sharp and severe.  Pain is worse with movement.  She states she has been working out over the past couple of weeks but no other new exertion or injury that she can recall.  She has never had problems with her back or hip before.  She states this came on suddenly at rest.  No abdominal pain or chest pain.  She states she has had nausea and multiple episodes of vomiting.  Her vomitus appears like coffee-ground emesis.  No previous history of GI bleed.  She is not on blood thinners.  Denies bloody stools or melena.  No fever.  No numbness or weakness.  ROS: See HPI Constitutional: no fever  Eyes: no drainage  ENT: no runny nose   Cardiovascular:  no chest pain  Resp: no SOB  GI: no vomiting GU: no dysuria Integumentary: no rash  Allergy: no hives  Musculoskeletal: no leg swelling  Neurological: no slurred speech ROS otherwise negative  PAST MEDICAL HISTORY/PAST SURGICAL HISTORY:  Past Medical History:  Diagnosis Date  . Diabetes mellitus   . Hypertension     MEDICATIONS:  Prior to Admission medications   Medication Sig Start Date End Date Taking? Authorizing Provider  cetirizine (ZYRTEC) 10 MG tablet Take 1 tablet daily as needed for allergies.  May take twice daily if necessary. 07/31/20   Molpus, John, MD  diphenhydrAMINE (BENADRYL) 25 MG tablet Take 1 tablet (25 mg total) by mouth every 6 (six) hours. 10/06/12   Rancour, Jeannett Senior, MD  EPINEPHrine (EPIPEN) 0.3 mg/0.3 mL DEVI Inject 0.3 mLs (0.3 mg total) into the muscle as needed. 10/06/12   Rancour, Jeannett Senior, MD  hydrochlorothiazide (MICROZIDE) 12.5 MG capsule Take 1 capsule (12.5 mg total) by mouth daily. 01/10/15    Lurene Shadow, PA-C  metFORMIN (GLUCOPHAGE) 500 MG tablet Take 500 mg by mouth 2 (two) times daily with a meal.    [provider]  Multiple Vitamin (MULITIVITAMIN WITH MINERALS) TABS Take 1 tablet by mouth daily.    [provider]  naphazoline-pheniramine (NAPHCON-A) 0.025-0.3 % ophthalmic solution Place 2 drops into both eyes every 4 (four) hours as needed for eye irritation or allergies. 07/31/20   Molpus, John, MD  nystatin cream (MYCOSTATIN) Apply to affected area 2 times daily 01/10/15   Lurene Shadow, PA-C    ALLERGIES:  Allergies  Allergen Reactions  . Pork-Derived Products Hives    SOCIAL HISTORY:  Social History   Tobacco Use  . Smoking status: Never Smoker  . Smokeless tobacco: Never Used  Substance Use Topics  . Alcohol use: No    FAMILY HISTORY: History reviewed. No pertinent family history.  EXAM: BP (!) 177/98 (BP Location: Left Arm)   Pulse 98   Temp 97.8 F (36.6 C) (Oral)   Resp 18   LMP 01/03/2015   SpO2 100%  CONSTITUTIONAL: Alert and oriented and responds appropriately to questions.  Obese.  Appears very uncomfortable. HEAD: Normocephalic EYES: Conjunctivae clear, pupils appear equal, EOM appear intact ENT: normal nose; moist mucous membranes NECK: Supple, normal ROM CARD: RRR; S1 and S2 appreciated; no murmurs, no clicks, no rubs, no gallops RESP: Normal chest excursion without splinting or tachypnea;  breath sounds clear and equal bilaterally; no wheezes, no rhonchi, no rales, no hypoxia or respiratory distress, speaking full sentences ABD/GI: Normal bowel sounds; non-distended; soft, non-tender, no rebound, no guarding, no peritoneal signs, no hepatosplenomegaly BACK:  The back appears normal, tender to palpation over the lower left paraspinal muscles without redness, warmth, ecchymosis or soft tissue swelling.  There is no midline spinal tenderness or step-off or deformity.  No CVA tenderness. EXT: Patient does have some tenderness  over the left lateral hip and the posterior left buttock but there is no redness, warmth, soft tissue swelling noted.  Difficult to ascertain if there is any joint effusion due to patient's body habitus.  Unable to range hip due to pain.  She has a 2+ left DP pulse.  No calf tenderness or calf swelling.  Normal sensation in the left leg.  Compartments left leg soft.   SKIN: Normal color for age and race; warm; no rash on exposed skin NEURO: Moves all extremities equally PSYCH: The patient's mood and manner are appropriate.   MEDICAL DECISION MAKING: Patient here with sudden onset pain.  Differential is broad including musculoskeletal pain, pathologic fracture, septic arthritis, kidney stone, dissection.  She is actively vomiting here and I have witnessed at least 10 episodes of vomiting.  She has had multiple with EMS as well.  Her vomit looks like coffee grounds.  Will send gastric occult.  Will give IV fluids, pain and nausea medicine.  Will obtain CT dissection study and while obtaining the study we will also image her left hip given my exam of her left hip is very limited due to body habitus and her level of pain.  ED PROGRESS: Patient's labs are reassuring.  Hemoglobin 11.8.  Normal LFTs and lipase.  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  No joint effusion of the hip.  No fracture.  No kidney stone.  No dissection.  She is now ambulatory.  Reports feeling much better after IV Dilaudid.    Patient's gastric occult is positive for blood.  She is not had any further vomiting here but did have multiple episodes of very dark appearing, coffee-ground appearing emesis without bright red blood.  Will give IV Protonix and keep her n.p.o.  Will discuss with medicine for admission as I feel she may benefit from GI evaluation for possible endoscopy.  She denies previous  history of endoscopy or colonoscopy.  No previous history of GI bleed.  She does not have a gastroenterologist.  She states she has been suffering from acid reflux for 3 years.  She has not had any further vomiting since receiving Zofran but did have multiple episodes in the ED. does take NSAIDs intermittently.  Reports she had ibuprofen yesterday.  6:50 AM  D/w Dr. Bosie Clos with gastroenterology who agrees with medicine admission for observation.  Patient also comfortable with this plan.  GI will see patient in consultation.  6:58 AM Discussed patient's case with hospitalist, Dr. Loney Loh.  I have recommended admission and patient (and family if present) agree with this plan. Admitting physician will place admission orders.   I reviewed all nursing notes, vitals, pertinent previous records and reviewed/interpreted all EKGs, lab and urine results, imaging (as available).     CRITICAL CARE Performed by: Rochele Raring   Total critical care time: 45 minutes  Critical care time was exclusive  of separately billable procedures and treating other patients.  Critical care was necessary to treat or prevent imminent or life-threatening deterioration.  Critical care was time spent personally by me on the following activities: development of treatment plan with patient and/or surrogate as well as nursing, discussions with consultants, evaluation of patient's response to treatment, examination of patient, obtaining history from patient or surrogate, ordering and performing treatments and interventions, ordering and review of laboratory studies, ordering and review of radiographic studies, pulse oximetry and re-evaluation of patient's condition.   [name] was evaluated in Emergency Department on 08/01/2020 for the symptoms described in the history of present illness. She was evaluated in the context of the global COVID-19 pandemic, which necessitated consideration that the patient might be at risk for  infection with the SARS-CoV-2 virus that causes COVID-19. Institutional protocols and algorithms that pertain to the evaluation of patients at risk for COVID-19 are in a state of rapid change based on information released by regulatory bodies including the CDC and federal and state organizations. These policies and algorithms were followed during the patient's care in the ED.      Hassani Sliney, Delice Bison, DO 08/01/20 [phone]

## 2020-08-02 ENCOUNTER — Inpatient Hospital Stay (HOSPITAL_COMMUNITY): Payer: Medicaid Other | Admitting: Anesthesiology

## 2020-08-02 ENCOUNTER — Encounter (HOSPITAL_COMMUNITY)
Admission: EM | Disposition: A | Discharge status: Home / Self Care | Payer: Self-pay | Source: Home / Self Care | Attending: Emergency Medicine

## 2020-08-02 DIAGNOSIS — K2971 Gastritis, unspecified, with bleeding: Secondary | ICD-10-CM | POA: Diagnosis not present

## 2020-08-02 DIAGNOSIS — E876 Hypokalemia: Secondary | ICD-10-CM

## 2020-08-02 DIAGNOSIS — M25552 Pain in left hip: Secondary | ICD-10-CM | POA: Diagnosis not present

## 2020-08-02 DIAGNOSIS — Z20822 Contact with and (suspected) exposure to covid-19: Secondary | ICD-10-CM | POA: Diagnosis not present

## 2020-08-02 DIAGNOSIS — M24652 Ankylosis, left hip: Secondary | ICD-10-CM

## 2020-08-02 DIAGNOSIS — I10 Essential (primary) hypertension: Secondary | ICD-10-CM

## 2020-08-02 DIAGNOSIS — K822 Perforation of gallbladder: Secondary | ICD-10-CM | POA: Diagnosis not present

## 2020-08-02 HISTORY — PX: ESOPHAGOGASTRODUODENOSCOPY (EGD) WITH PROPOFOL: SHX5813

## 2020-08-02 HISTORY — PX: BIOPSY: SHX5522

## 2020-08-02 LAB — GLUCOSE, CAPILLARY
Glucose-Capillary: 120 mg/dL — ABNORMAL HIGH (ref 70–99)
Glucose-Capillary: 110 mg/dL — ABNORMAL HIGH (ref 70–99)
Glucose-Capillary: 180 mg/dL — ABNORMAL HIGH (ref 70–99)

## 2020-08-02 LAB — MAGNESIUM: Magnesium: 1.8 mg/dL (ref 1.7–2.4)

## 2020-08-02 LAB — BASIC METABOLIC PANEL
Anion gap: 11 (ref 5–15)
BUN: 7 mg/dL (ref 6–20)
CO2: 26 mmol/L (ref 22–32)
Calcium: 8.7 mg/dL — ABNORMAL LOW (ref 8.9–10.3)
Chloride: 105 mmol/L (ref 98–111)
Creatinine, Ser: 0.78 mg/dL (ref 0.44–1.00)
GFR, Estimated: >60 mL/min (ref >60)
Glucose, Bld: 98 mg/dL (ref 70–99)
Potassium: 3 mmol/L — ABNORMAL LOW (ref 3.5–5.1)
Sodium: 142 mmol/L (ref 135–145)

## 2020-08-02 LAB — CBC
HCT: 34.3 % — ABNORMAL LOW (ref 36.0–46.0)
Hemoglobin: 11 g/dL — ABNORMAL LOW (ref 12.0–15.0)
MCH: 29 pg (ref 26.0–34.0)
MCHC: 32.1 g/dL (ref 30.0–36.0)
MCV: 90.5 fL (ref 80.0–100.0)
Platelets: 258 10*3/uL (ref 150–400)
RBC: 3.79 MIL/uL — ABNORMAL LOW (ref 3.87–5.11)
RDW: 14.8 % (ref 11.5–15.5)
WBC: 7.8 10*3/uL (ref 4.0–10.5)
nRBC: 0 % (ref 0.0–0.2)

## 2020-08-02 LAB — POTASSIUM: Potassium: 3.6 mmol/L (ref 3.5–5.1)

## 2020-08-02 SURGERY — ESOPHAGOGASTRODUODENOSCOPY (EGD) WITH PROPOFOL
Anesthesia: Monitor Anesthesia Care

## 2020-08-02 MED ORDER — LACTATED RINGERS IV SOLN: INTRAVENOUS | Status: DC | PRN

## 2020-08-02 MED ORDER — PROPOFOL 10 MG/ML IV BOLUS
INTRAVENOUS | Status: AC
  Filled 2020-08-02: qty 20

## 2020-08-02 MED ORDER — PANTOPRAZOLE SODIUM 40 MG PO TBEC: 40.0000 mg | DELAYED_RELEASE_TABLET | Freq: Two times a day (BID) | ORAL | 0 refills | Status: AC

## 2020-08-02 MED ORDER — PROPOFOL 500 MG/50ML IV EMUL
INTRAVENOUS | Status: AC
  Filled 2020-08-02: qty 100

## 2020-08-02 MED ORDER — POTASSIUM CHLORIDE CRYS ER 20 MEQ PO TBCR
40.0000 meq | EXTENDED_RELEASE_TABLET | ORAL | Status: AC
  Administered 2020-08-02 (×2): 40 meq via ORAL
  Filled 2020-08-02 (×2): qty 2

## 2020-08-02 MED ORDER — SODIUM CHLORIDE 0.9 % IV SOLN: INTRAVENOUS | Status: DC

## 2020-08-02 MED ORDER — PROPOFOL 1000 MG/100ML IV EMUL
INTRAVENOUS | Status: AC
  Filled 2020-08-02: qty 200

## 2020-08-02 MED ORDER — PROPOFOL 10 MG/ML IV BOLUS
INTRAVENOUS | Status: DC | PRN
  Administered 2020-08-02: 20 mg via INTRAVENOUS

## 2020-08-02 MED ORDER — PROPOFOL 500 MG/50ML IV EMUL
INTRAVENOUS | Status: DC | PRN
  Administered 2020-08-02: 75 ug/kg/min via INTRAVENOUS

## 2020-08-02 MED ORDER — LIDOCAINE 2% (20 MG/ML) 5 ML SYRINGE
INTRAMUSCULAR | Status: DC | PRN
  Administered 2020-08-02: 100 mg via INTRAVENOUS

## 2020-08-02 MED ORDER — PANTOPRAZOLE SODIUM 40 MG PO TBEC
40.0000 mg | DELAYED_RELEASE_TABLET | Freq: Two times a day (BID) | ORAL | Status: DC
Start: 2020-08-02 — End: 2020-08-03
  Administered 2020-08-02: 40 mg via ORAL
  Filled 2020-08-02: qty 1

## 2020-08-02 SURGICAL SUPPLY — 15 items

## 2020-08-02 NOTE — Evaluation (Signed)
Physical Therapy One Time Evaluation Patient Details Name: [name] MRN: [id number] DOB: October 17, 1965 Today's Date: 08/02/2020   History of Present Illness  55 year old female with past medical history of hypertension, diabetes with neuropathy who presented to the ED with acute onset left lower back, buttock and left lateral hip pain that started yesterday evening.  Yesterday the patient was playing with her dog during the day but felt fine.  In the evening she was sitting and felt some discomfort in her left low back and reached to get some medication and had acute onset severe pain which she had never had before.  Pt admitted for Left low back/hip pain likely musculoskeletal and Intractable nausea and vomiting with UGI bleed in the setting of NSAID use  Clinical Impression  Patient evaluated by Physical Therapy with no further acute PT needs identified. All education has been completed and the patient has no further questions.  Pt reports left hip pain from injury prior to admission feels better.  Pt encouraged to seek OPPT if symptoms or pain persist.  Pt typically uses SPC at baseline and encouraged to use assistive device upon d/c for safety. Pt hopeful for d/c home today. See below for any follow-up Physical Therapy or equipment needs. PT is signing off. Thank you for this referral.     Follow Up Recommendations Outpatient PT (pt has an OPPT she prefers and will use if needed)    Equipment Recommendations  None recommended by PT    Recommendations for Other Services       Precautions / Restrictions Precautions Precautions: None      Mobility  Bed Mobility Overal bed mobility: Modified Independent                  Transfers Overall transfer level: Needs assistance Equipment used: None Transfers: Sit to/from Stand Sit to Stand: Supervision            Ambulation/Gait Ambulation/Gait assistance: Supervision Gait Distance (Feet): 160 Feet Assistive device:  None Gait Pattern/deviations: Step-through pattern;Decreased stride length     General Gait Details: increased time, increased bil trunk sway, pt reports hx of peripheral neuropathy and slow cautious gait due to this, denies left hip pain until end of ambulation  Stairs            Wheelchair Mobility    Modified Rankin (Stroke Patients Only)       Balance Overall balance assessment: Mild deficits observed, not formally tested                                           Pertinent Vitals/Pain Pain Assessment: 0-10 Pain Score: 3  Pain Location: left hip end of ambulation Pain Descriptors / Indicators: Sore Pain Intervention(s): Monitored during session;Repositioned    Home Living Family/patient expects to be discharged to:: Private residence Living Arrangements: Alone   Type of Home: House       Home Layout: Two level Home Equipment: Environmental consultant - 2 wheels;Cane - single point      Prior Function Level of Independence: Independent with assistive device(s)         Comments: uses cane     Hand Dominance        Extremity/Trunk Assessment        Lower Extremity Assessment Lower Extremity Assessment: RLE deficits/detail;LLE deficits/detail RLE Sensation: history of peripheral neuropathy LLE Deficits / Details: pt left  hip injury prior to admission however reports pain has improved LLE Sensation: history of peripheral neuropathy       Communication   Communication: No difficulties  Cognition Arousal/Alertness: Awake/alert Behavior During Therapy: WFL for tasks assessed/performed Overall Cognitive Status: Within Functional Limits for tasks assessed                                        General Comments      Exercises     Assessment/Plan    PT Assessment Patent does not need any further PT services  PT Problem List         PT Treatment Interventions      PT Goals (Current goals can be found in the Care Plan  section)  Acute Rehab PT Goals PT Goal Formulation: All assessment and education complete, DC therapy    Frequency     Barriers to discharge        Co-evaluation               AM-PAC PT "6 Clicks" Mobility  Outcome Measure Help needed turning from your back to your side while in a flat bed without using bedrails?: None Help needed moving from lying on your back to sitting on the side of a flat bed without using bedrails?: None Help needed moving to and from a bed to a chair (including a wheelchair)?: None Help needed standing up from a chair using your arms (e.g., wheelchair or bedside chair)?: None Help needed to walk in hospital room?: A Little Help needed climbing 3-5 steps with a railing? : A Little 6 Click Score: 22    End of Session   Activity Tolerance: Patient tolerated treatment well Patient left: in bed;with call bell/phone within reach Nurse Communication: Mobility status PT Visit Diagnosis: Difficulty in walking, not elsewhere classified (R26.2)    Time: 5885-0277 PT Time Calculation (min) (ACUTE ONLY): 9 min   Charges:   PT Evaluation $PT Eval Low Complexity: 1 Low         Kati PT, DPT Acute Rehabilitation Services Pager: [phone] Office: [phone]    Maida Sale E 08/02/2020, 1:37 PM

## 2020-08-02 NOTE — Interval H&P Note (Signed)
History and Physical Interval Note:  08/02/2020 8:47 AM  [name]  has presented today for surgery, with the diagnosis of Hematemesis.  The various methods of treatment have been discussed with the patient and family. After consideration of risks, benefits and other options for treatment, the patient has consented to  Procedure(s): ESOPHAGOGASTRODUODENOSCOPY (EGD) WITH PROPOFOL (N/A) as a surgical intervention.  The patient's history has been reviewed, patient examined, no change in status, stable for surgery.  I have reviewed the patient's chart and labs.  Questions were answered to the patient's satisfaction.     Brianne Maina

## 2020-08-02 NOTE — Anesthesia Procedure Notes (Signed)
Procedure Name: MAC Date/Time: 08/02/2020 9:00 AM Performed by: West Pugh, CRNA Pre-anesthesia Checklist: Patient identified, Emergency Drugs available, Suction available, Patient being monitored and Timeout performed Patient Re-evaluated:Patient Re-evaluated prior to induction Oxygen Delivery Method: Simple face mask Preoxygenation: Pre-oxygenation with 100% oxygen Induction Type: IV induction Number of attempts: 1 Placement Confirmation: positive ETCO2 Dental Injury: Teeth and Oropharynx as per pre-operative assessment

## 2020-08-02 NOTE — Progress Notes (Signed)
Immediate recheck by RN shows MEWS in green (score of 1). No further interventions necessary.

## 2020-08-02 NOTE — Progress Notes (Signed)
PT Cancellation Note  Patient Details Name: [name] MRN: [id number] DOB: [birth year]   Cancelled Treatment:    Reason Eval/Treat Not Completed: Patient at procedure or test/unavailable (endo)   Mel Tadros,KATHrine E 08/02/2020, 9:27 AM Paulino Door, DPT Acute Rehabilitation Services Pager: [phone] Office: [phone]

## 2020-08-02 NOTE — Discharge Summary (Addendum)
Physician Discharge Summary  [name] YWV:[id number] DOB: 06-27-1966 DOA: 08/01/2020  PCP: Default, Provider, MD  Admit date: 08/01/2020 Discharge date: 08/02/2020  Discharge disposition: Home   Recommendations for Outpatient Follow-Up:   Follow-up with PCP in 1 to 2 weeks   Discharge Diagnosis:   Principal Problem:   Left hip pain Active Problems:   GI bleed   Essential hypertension   Type 2 diabetes mellitus with other specified complication ([place])   Ankylosis of left hip   Hypokalemia    Discharge Condition: Stable.  Diet recommendation:  Diet Order            DIET SOFT Room service appropriate? Yes; Fluid consistency: Thin  Diet effective now           Diet - low sodium heart healthy                   Code Status: Full Code     Hospital Course:   Ms. [name] is a 55 year old female with past medical history of hypertension, diabetes with neuropathy who presented to the ED with acute onset left lower back, buttock and left lateral hip pain that started a day prior to admission.  Soon after developing acute left hip pain, she developed nausea and vomiting.  She vomited coffee-ground emesis.  She was admitted to the hospital for acute upper GI bleeding and acute left hip pain.  She was treated with IV fluids, IV Protonix, antiemetics and analgesics.  She was evaluated by the gastroenterologist and she underwent an EGD on 08/02/2020 which showed grade A reflux esophagitis, gastritis duodenitis.  She said she had been taking ibuprofen for pain.  It is not clear whether ibuprofen is a direct cause of the findings on EGD.  In any case, she has been advised to avoid NSAIDs including but not limited to ibuprofen, diclofenac, ketorolac, Aleve.  She was discharged on oral Protonix.  CT of the left hip did not show any fracture or dislocation of the left hip.  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  She was evaluated by physical therapist and outpatient physical therapy was recommended.  She had hypokalemia that was successfully corrected.  Her condition is improved and she is deemed stable for discharge to home today.     Medical Consultants:    Gastroenterologist   Discharge Exam:    Vitals:   08/02/20 0925 08/02/20 0930 08/02/20 1033 08/02/20 1300  BP: 101/68 118/63 121/75 121/71  Pulse:  94 81 (!) 101  Resp: (!) 21 20 18 16   Temp:   98.4 F (36.9 C) 98.5 F (36.9 C)  TempSrc:   Oral Oral  SpO2: 99% 97% 97% 99%  Weight:      Height:         GEN: NAD SKIN: No rash EYES: EOMI ENT: MMM CV: RRR PULM: CTA B ABD: soft, Oobese, NT, +BS CNS: AAO x 3, non focal EXT: No edema or tenderness MSK: No left hip tenderness, swelling or erythema.   The results of significant diagnostics from this hospitalization (including imaging, microbiology, ancillary and laboratory) are listed below for reference.     Procedures and Diagnostic Studies:   CT Hip Left Wo Contrast  Result Date: 08/01/2020 CLINICAL DATA:  Left hip pain EXAM: CT OF THE LEFT HIP WITHOUT CONTRAST TECHNIQUE: Multidetector CT imaging of the left hip was performed according to the standard  protocol. Multiplanar CT image reconstructions were also generated. COMPARISON:  None. FINDINGS: Bones/Joint/Cartilage No fracture or dislocation of the left hip. There is ankylosis of the left sacroiliac joint. There is sclerotic change on both sides of the left sacroiliac joint, greater on the iliac bone. Ligaments Suboptimally assessed by CT. Muscles and Tendons Unremarkable. Soft tissues Visualized intraperitoneal soft tissues are unremarkable. IMPRESSION: 1. No acute fracture or dislocation of the left hip. 2. 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 Electronically Signed   By: Deatra Robinson M.D.   On: 08/01/2020 04:49    CT ANGIO CHEST AORTA W/CM & OR WO/CM  Result Date: 08/01/2020 CLINICAL DATA:  Aortic disease EXAM: CT ANGIOGRAPHY CHEST WITH CONTRAST TECHNIQUE: Multidetector CT imaging of the chest was performed using the standard protocol during bolus administration of intravenous contrast. Multiplanar CT image reconstructions and MIPs were obtained to evaluate the vascular anatomy. CONTRAST:  OMNIPAQUE IOHEXOL 350 MG/ML SOLN COMPARISON:  None. FINDINGS: Cardiovascular: No aortic intramural hematoma. Preferential opacification of the thoracic aorta. No evidence of thoracic aortic aneurysm or dissection. Normal heart size. No pericardial effusion. Mediastinum/Nodes: No enlarged mediastinal, hilar, or axillary lymph nodes. Thyroid gland, trachea, and esophagus demonstrate no significant findings. Lungs/Pleura: Lungs are clear. No pleural effusion or pneumothorax. Upper Abdomen: No acute abnormality. Musculoskeletal: No chest wall abnormality. No acute or significant osseous findings. Review of the MIP images confirms the above findings. IMPRESSION: No acute aortic syndrome or other acute thoracic abnormality. Electronically Signed   By: Deatra Robinson M.D.   On: 08/01/2020 04:42     Labs:   Basic Metabolic Panel: Recent Labs  Lab 08/01/20 0247 08/02/20 0447 08/02/20 1529  NA 138 142  --   K 3.4* 3.0* 3.6  CL 100 105  --   CO2 25 26  --   GLUCOSE 231* 98  --   BUN 14 7  --   CREATININE 0.88 0.78  --   CALCIUM 9.1 8.7*  --   MG  --  1.8  --    GFR Estimated Creatinine Clearance: 121 mL/min (by C-G formula based on SCr of 0.78 mg/dL). Liver Function Tests: Recent Labs  Lab 08/01/20 0247  AST 17  ALT 15  ALKPHOS 89  BILITOT 0.9  PROT 7.9  ALBUMIN 3.5   Recent Labs  Lab 08/01/20 0247  LIPASE 41   No results for input(s): AMMONIA in the last 168 hours. Coagulation profile No results for input(s): INR, PROTIME in the last 168 hours.  CBC: Recent Labs  Lab 08/01/20 0247  08/02/20 0447  WBC 12.0* 7.8  HGB 11.8* 11.0*  HCT 37.5 34.3*  MCV 89.7 90.5  PLT 266 258   Cardiac Enzymes: No results for input(s): CKTOTAL, CKMB, CKMBINDEX, TROPONINI in the last 168 hours. BNP: Invalid input(s): POCBNP CBG: Recent Labs  Lab 08/01/20 1646 08/01/20 2051 08/02/20 0735 08/02/20 1121 08/02/20 1607  GLUCAP 106* 149* 120* 110* 180*   D-Dimer No results for input(s): DDIMER in the last 72 hours. Hgb A1c Recent Labs    08/01/20 1214  HGBA1C 9.2*   Lipid Profile No results for input(s): CHOL, HDL, LDLCALC, TRIG, CHOLHDL, LDLDIRECT in the last 72 hours. Thyroid function studies No results for input(s): TSH, T4TOTAL, T3FREE, THYROIDAB in the last 72 hours.  Invalid input(s): FREET3 Anemia work up No results for input(s): VITAMINB12, FOLATE, FERRITIN, TIBC, IRON, RETICCTPCT in the last 72 hours. Microbiology Recent Results (from the past  240 hour(s))  Resp Panel by RT-PCR (Flu A&B, Covid) Nasopharyngeal Swab     Status: None   Collection Time: 08/01/20  6:20 AM   Specimen: Nasopharyngeal Swab; Nasopharyngeal(NP) swabs in vial transport medium  Result Value Ref Range Status   SARS Coronavirus 2 by RT PCR NEGATIVE NEGATIVE Final    Comment: (NOTE) SARS-CoV-2 target nucleic acids are NOT DETECTED.  The SARS-CoV-2 RNA is generally detectable in upper respiratory specimens during the acute phase of infection. The lowest concentration of SARS-CoV-2 viral copies this assay can detect is 138 copies/mL. A negative result does not preclude SARS-Cov-2 infection and should not be used as the sole basis for treatment or other patient management decisions. A negative result may occur with  improper specimen collection/handling, submission of specimen other than nasopharyngeal swab, presence of viral mutation(s) within the areas targeted by this assay, and inadequate number of viral copies(<138 copies/mL). A negative result must be combined with clinical  observations, patient history, and epidemiological information. The expected result is Negative.  Fact Sheet for Patients:  BloggerCourse.com  Fact Sheet for Healthcare Providers:  SeriousBroker.it  This test is no t yet approved or cleared by the [place] FDA and  has been authorized for detection and/or diagnosis of SARS-CoV-2 by FDA under an Emergency Use Authorization (EUA). This EUA will remain  in effect (meaning this test can be used) for the duration of the COVID-19 declaration under Section 564(b)(1) of the Act, 21 U.S.C.section 360bbb-3(b)(1), unless the authorization is terminated  or revoked sooner.       Influenza A by PCR NEGATIVE NEGATIVE Final   Influenza B by PCR NEGATIVE NEGATIVE Final    Comment: (NOTE) The Xpert Xpress SARS-CoV-2/FLU/RSV plus assay is intended as an aid in the diagnosis of influenza from Nasopharyngeal swab specimens and should not be used as a sole basis for treatment. Nasal washings and aspirates are unacceptable for Xpert Xpress SARS-CoV-2/FLU/RSV testing.  Fact Sheet for Patients: BloggerCourse.com  Fact Sheet for Healthcare Providers: SeriousBroker.it  This test is not yet approved or cleared by the [place] FDA and has been authorized for detection and/or diagnosis of SARS-CoV-2 by FDA under an Emergency Use Authorization (EUA). This EUA will remain in effect (meaning this test can be used) for the duration of the COVID-19 declaration under Section 564(b)(1) of the Act, 21 U.S.C. section 360bbb-3(b)(1), unless the authorization is terminated or revoked.  Performed at [facility], [address] [place], [place] [address]      Discharge Instructions:   Discharge Instructions    Ambulatory referral to Physical Therapy   Complete by: As directed    Diet - low sodium heart healthy   Complete by: As  directed    Discharge instructions   Complete by: As directed    Avoid aspirin and NSAIDs including but not limited to ibuprofen, naproxen, diclofenac, ketorolac.   Increase activity slowly   Complete by: As directed      Allergies as of 08/02/2020      Reactions   Lactase Diarrhea   Other reaction(s): Cough (ALLERGY/intolerance)   Pork-derived Products Hives      Medication List    STOP taking these medications   ibuprofen 200 MG tablet Commonly known as: ADVIL     TAKE these medications   atorvastatin 40 MG tablet Commonly known as: LIPITOR Take 40 mg by mouth at bedtime.   B COMPLEX PO Take 1 tablet by mouth daily.   cetirizine 10 MG  tablet Commonly known as: ZYRTEC Take 1 tablet daily as needed for allergies.  May take twice daily if necessary.   citalopram 40 MG tablet Commonly known as: CELEXA Take 40 mg by mouth daily.   hydrochlorothiazide 12.5 MG tablet Commonly known as: HYDRODIURIL Take 12.5 mg by mouth daily. What changed: Another medication with the same name was removed. Continue taking this medication, and follow the directions you see here.   lisinopril 10 MG tablet Commonly known as: ZESTRIL Take 10 mg by mouth daily.   naphazoline-pheniramine 0.025-0.3 % ophthalmic solution Commonly known as: NAPHCON-A Place 2 drops into both eyes every 4 (four) hours as needed for eye irritation or allergies.   Ozempic (0.25 or 0.5 MG/DOSE) 2 MG/1.5ML Sopn Generic drug: Semaglutide(0.25 or 0.5MG /DOS) Inject 0.5 mLs into the skin once a week.   pantoprazole 40 MG tablet Commonly known as: PROTONIX Take 1 tablet (40 mg total) by mouth 2 (two) times daily.   Toujeo SoloStar 300 UNIT/ML Solostar Pen Generic drug: insulin glargine (1 Unit Dial) Inject 35 Units into the skin daily.       Follow-up Information    Brahmbhatt, Parag, MD. Schedule an appointment as soon as possible for a visit in 2 month(s).   Specialty: Gastroenterology Why: GI  bleed Contact information: [address] [place] [address] [place] [address] [phone]                Time coordinating discharge: 33 minutes  Signed:  Lurene Shadow  [facility] 08/02/2020, 4:33 PM   Pager on [email or web address]. If 7PM-7AM, please contact night-coverage at [email or web address]

## 2020-08-02 NOTE — Op Note (Signed)
[facility] Patient Name: [name] Procedure Date: 08/02/2020 MRN: [id number] Attending MD: Kathi Der , MD Date of Birth: 08-15-1965 CSN: [id number] Age: 55 Admit Type: Inpatient Procedure:                Upper GI endoscopy Indications:              Hematemesis Providers:                Kathi Der, MD, Zoe Lan, RN, Kandice Robinsons, Technician Referring MD:              Medicines:                Sedation Administered by an Anesthesia Professional Complications:            No immediate complications. Estimated Blood Loss:     Estimated blood loss was minimal. Procedure:                Pre-Anesthesia Assessment:                           - Prior to the procedure, a History and Physical                            was performed, and patient medications and                            allergies were reviewed. The patient's tolerance of                            previous anesthesia was also reviewed. The risks                            and benefits of the procedure and the sedation                            options and risks were discussed with the patient.                            All questions were answered, and informed consent                            was obtained. Prior Anticoagulants: The patient has                            taken no previous anticoagulant or antiplatelet                            agents except for NSAID medication. ASA Grade                            Assessment: IV - A patient with severe systemic  disease that is a constant threat to life. After                            reviewing the risks and benefits, the patient was                            deemed in satisfactory condition to undergo the                            procedure.                           After obtaining informed consent, the endoscope was                            passed under direct vision.  Throughout the                            procedure, the patient's blood pressure, pulse, and                            oxygen saturations were monitored continuously. The                            [id number]-[id number] ([id number]) was introduced through the                            mouth, and advanced to the second part of duodenum.                            The upper GI endoscopy was accomplished without                            difficulty. The patient tolerated the procedure                            well. Findings:      LA Grade A (one or more mucosal breaks less than 5 mm, not extending       between tops of 2 mucosal folds) esophagitis with no bleeding was found       at the gastroesophageal junction.      The Z-line was irregular and was found 38 cm from the incisors. Biopsies       were taken with a cold forceps for histology.      Scattered mild inflammation characterized by congestion (edema),       erythema and friability was found in the gastric fundus and in the       prepyloric region of the stomach. Biopsies were taken with a cold       forceps for histology. There was mild scope trauma in the fundus during       retroflexion.      Scattered mild inflammation characterized by congestion (edema) and       erythema was found in the duodenal bulb.      The first portion of the duodenum and second portion of the duodenum  were normal. Impression:               - LA Grade A reflux esophagitis with no bleeding.                           - Z-line irregular, 38 cm from the incisors.                            Biopsied.                           - Gastritis. Biopsied.                           - Duodenitis.                           - Normal first portion of the duodenum and second                            portion of the duodenum. Moderate Sedation:      Moderate (conscious) sedation was personally administered by an       anesthesia professional. The following parameters were  monitored: oxygen       saturation, heart rate, blood pressure, and response to care. Recommendation:           - Return patient to hospital ward for ongoing care.                           - Soft diet.                           - Continue present medications.                           - Await pathology results.                           - No ibuprofen, naproxen, or other non-steroidal                            anti-inflammatory drugs. Procedure Code(s):        --- Professional ---                           [phone], Esophagogastroduodenoscopy, flexible,                            transoral; with biopsy, single or multiple Diagnosis Code(s):        --- Professional ---                           K21.00, Gastro-esophageal reflux disease with                            esophagitis, without bleeding  K22.8, Other specified diseases of esophagus                           K29.70, Gastritis, unspecified, without bleeding                           K29.80, Duodenitis without bleeding                           K92.0, Hematemesis CPT copyright 2019 American Medical Association. All rights reserved. The codes documented in this report are preliminary and upon coder review may  be revised to meet current compliance requirements. Otis Brace, MD Otis Brace, MD 08/02/2020 9:16:59 AM Number of Addenda: 0

## 2020-08-02 NOTE — Transfer of Care (Signed)
Immediate Anesthesia Transfer of Care Note  Patient: [name]  Procedure(s) Performed: ESOPHAGOGASTRODUODENOSCOPY (EGD) WITH PROPOFOL (N/A ) BIOPSY  Patient Location: PACU  Anesthesia Type:MAC  Level of Consciousness: awake, alert  and patient cooperative  Airway & Oxygen Therapy: Patient Spontanous Breathing and Patient connected to face mask oxygen  Post-op Assessment: Report given to RN and Post -op Vital signs reviewed and stable  Post vital signs: Reviewed and stable  Last Vitals:  Vitals Value Taken Time  BP    Temp    Pulse 94 0915 08/02/20  Resp    SpO2 100% 0915 08/02/20    Last Pain:  Vitals:   08/02/20 0823  TempSrc: Oral  PainSc: 0-No pain      Patients Stated Pain Goal: 0 (65/68/12 7517)  Complications: No complications documented.

## 2020-08-02 NOTE — Brief Op Note (Signed)
08/01/2020 - 08/02/2020  9:17 AM  PATIENT:  [name]  55 y.o. female  PRE-OPERATIVE DIAGNOSIS:  Hematemesis  POST-OPERATIVE DIAGNOSIS:  gastritis, gastric and esophageal biopsies  PROCEDURE:  Procedure(s): ESOPHAGOGASTRODUODENOSCOPY (EGD) WITH PROPOFOL (N/A) BIOPSY  SURGEON:  Surgeon(s) and Role:    * Hortencia Martire, MD - Primary  Findings ---------- -EGD showed mild esophagitis and gastritis.  No evidence of active bleeding or ulcer disease.  Biopsies taken.  Recommendations ------------------------- -Start soft diet and advance as tolerated -Recommend Protonix 40 mg once a day for 4 to 8 weeks -Avoid NSAIDs -No further inpatient GI work-up planned.  Okay to discharge from GI standpoint.  GI will sign off.  Call us back if needed -Follow-up in GI clinic in 2 months  Kathi Der MD, FACP 08/02/2020, 9:19 AM  Contact #  [phone]

## 2020-08-03 ENCOUNTER — Other Ambulatory Visit: Payer: Self-pay

## 2020-08-03 LAB — SURGICAL PATHOLOGY

## 2020-08-03 NOTE — Anesthesia Postprocedure Evaluation (Signed)
Anesthesia Post Note  Patient: [name]  Procedure(s) Performed: ESOPHAGOGASTRODUODENOSCOPY (EGD) WITH PROPOFOL (N/A ) BIOPSY     Patient location during evaluation: PACU Anesthesia Type: MAC Level of consciousness: awake and alert Pain management: pain level controlled Vital Signs Assessment: post-procedure vital signs reviewed and stable Respiratory status: spontaneous breathing Cardiovascular status: stable Anesthetic complications: no   No complications documented.  Last Vitals:  Vitals:   08/02/20 1033 08/02/20 1300  BP: 121/75 121/71  Pulse: 81 (!) 101  Resp: 18 16  Temp: 36.9 C 36.9 C  SpO2: 97% 99%    Last Pain:  Vitals:   08/02/20 1300  TempSrc: Oral  PainSc:                  Nolon Nations

## 2020-08-04 ENCOUNTER — Encounter (HOSPITAL_COMMUNITY): Payer: Self-pay | Admitting: Gastroenterology

## 2021-07-16 IMAGING — CT CT HIP*L* W/O CM
2 of 4 series · 17 of 46 positions shown, 19 images · non-contrast
Comparison: None.

CLINICAL DATA: Left hip pain

EXAM:
CT OF THE LEFT HIP WITHOUT CONTRAST
TECHNIQUE: Multidetector CT imaging of the left hip was performed according to
the standard protocol. Multiplanar CT image reconstructions were
also generated.

[Series 3: axial st · axial · 0.56mm/px · z∈[-236,-60]mm · 14 of 102 slices shown, 16 images]
[im 7/102  soft-tissue]
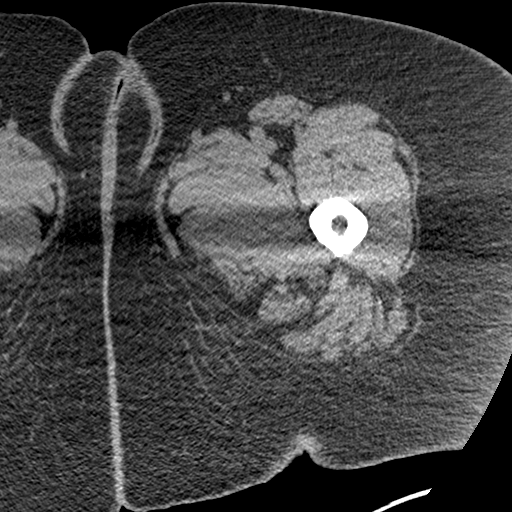
[im 7/102  bone]
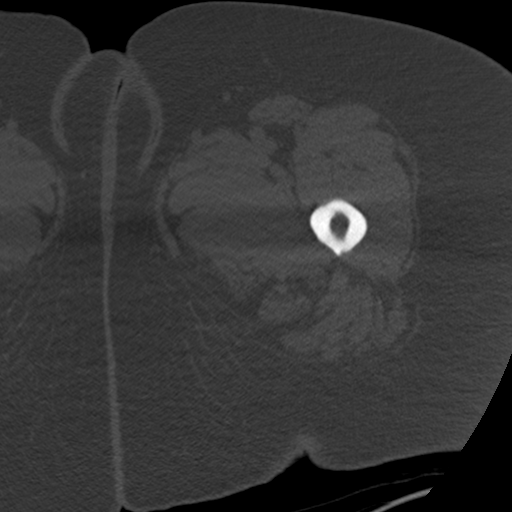
[im 14/102  soft-tissue]
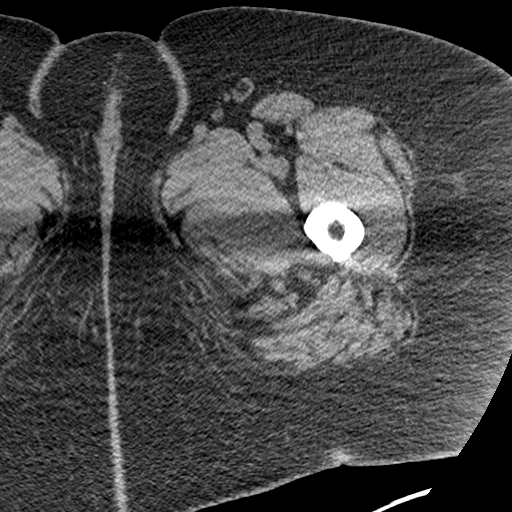
[im 20/102  soft-tissue]
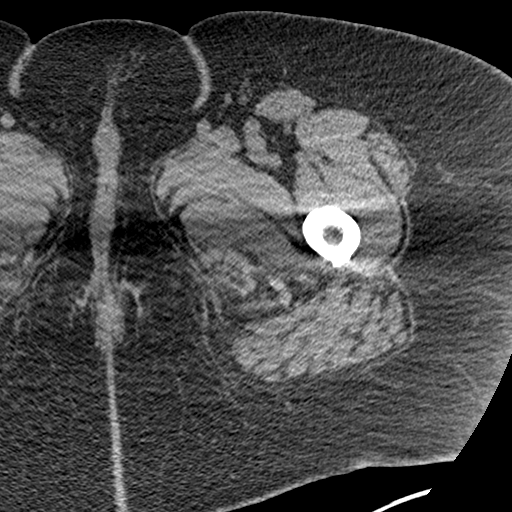
[im 27/102  soft-tissue]
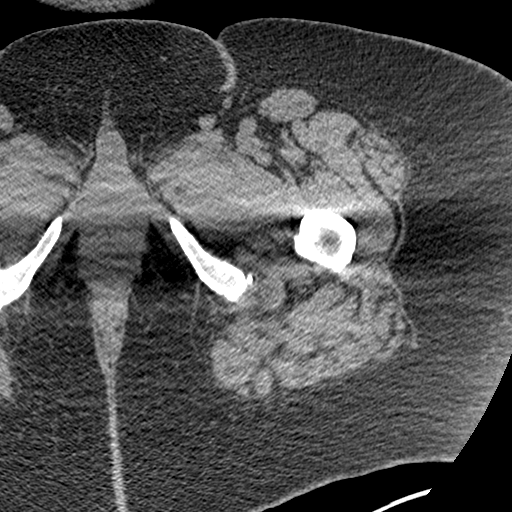
[im 33/102  soft-tissue]
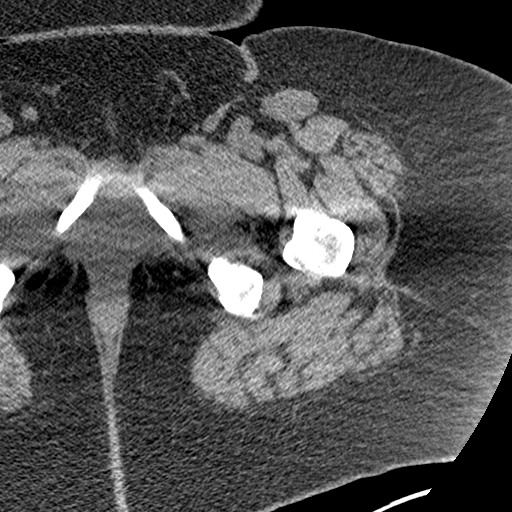
[im 40/102  soft-tissue]
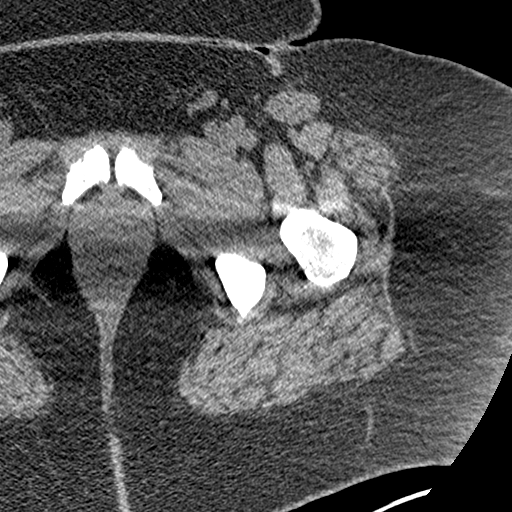
[im 46/102  soft-tissue]
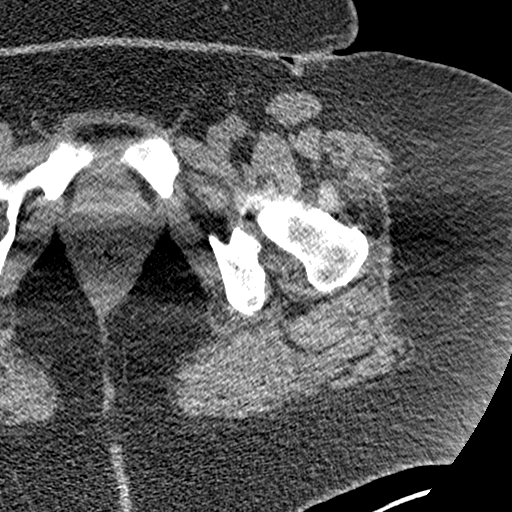
[im 56/102  soft-tissue]
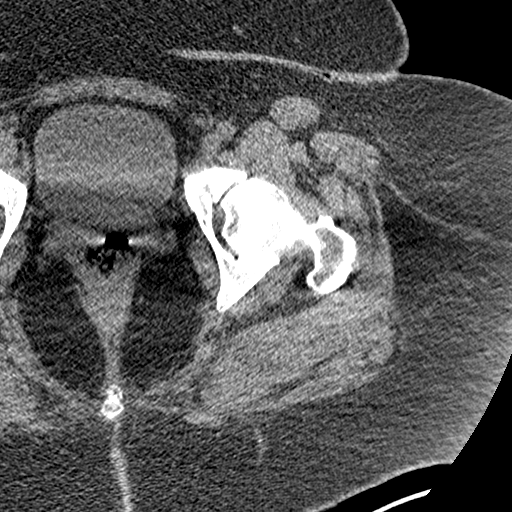
[im 62/102  soft-tissue]
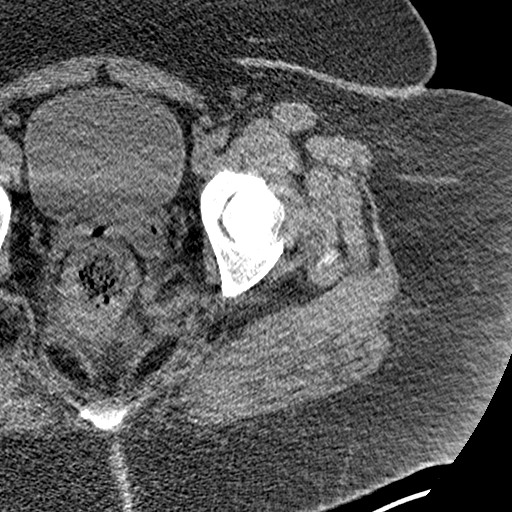
[im 62/102  bone]
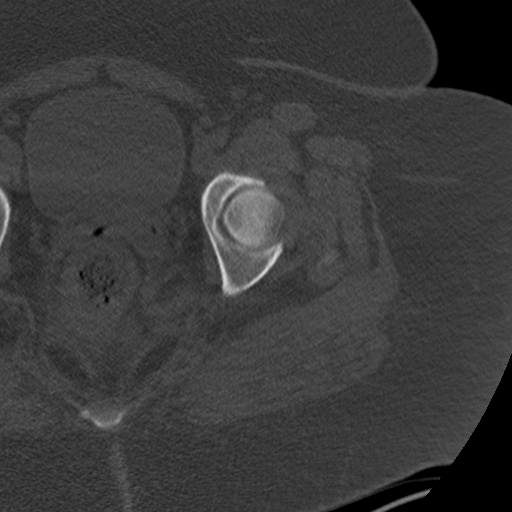
[im 69/102  soft-tissue]
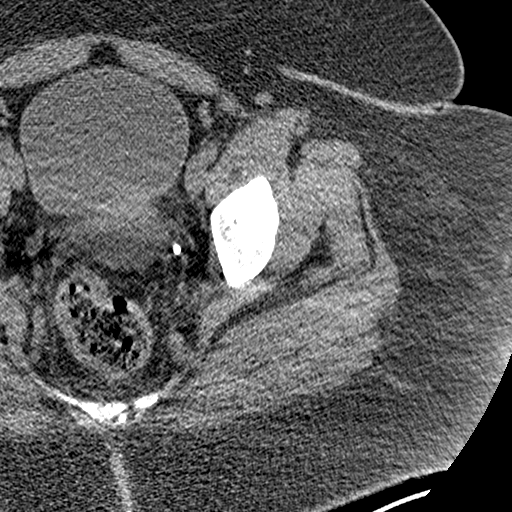
[im 75/102  soft-tissue]
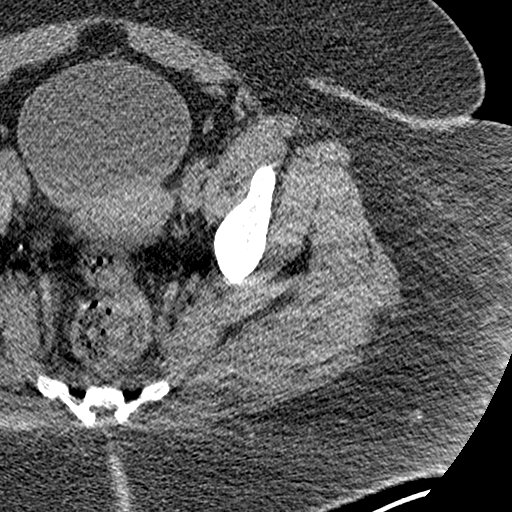
[im 82/102  soft-tissue]
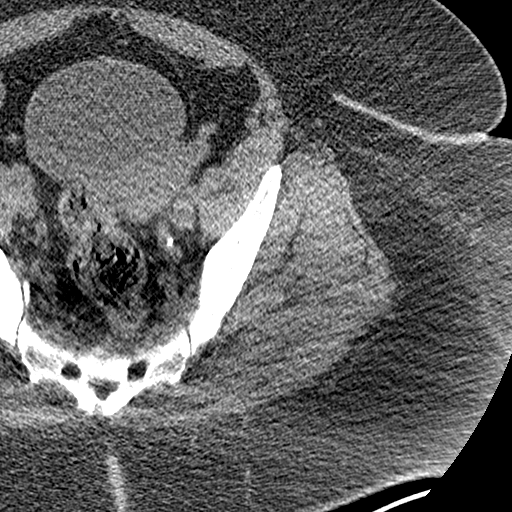
[im 88/102  soft-tissue]
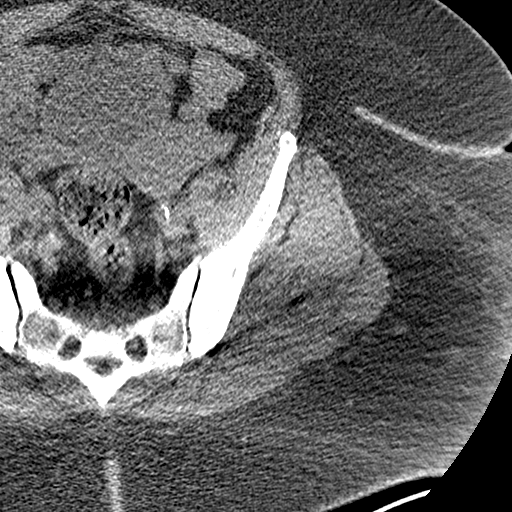
[im 95/102  soft-tissue]
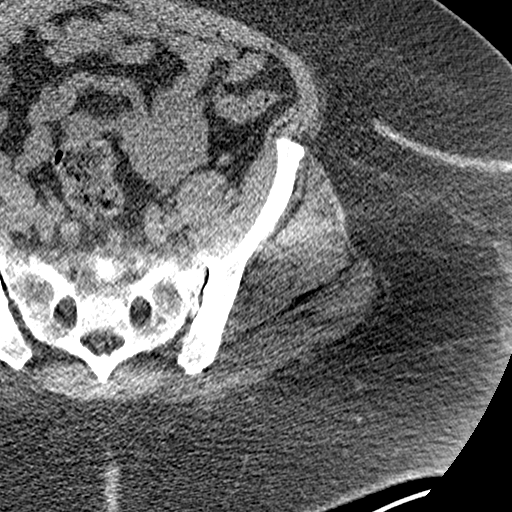

[Series 8: coronal st · coronal · 0.42mm/px · 3 of 117 slices shown]
[im 24/117  soft-tissue]
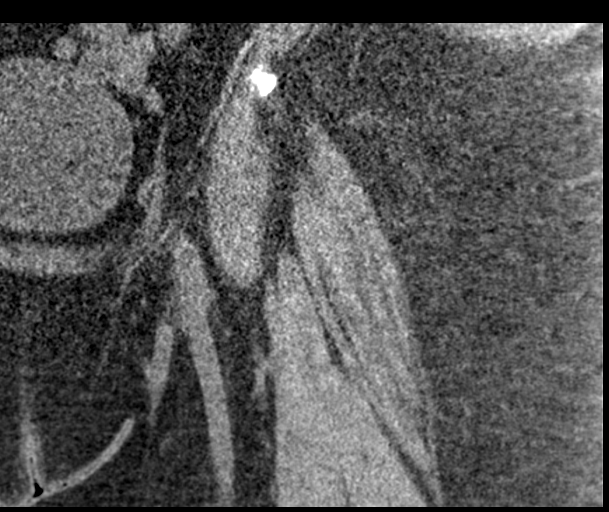
[im 47/117  soft-tissue]
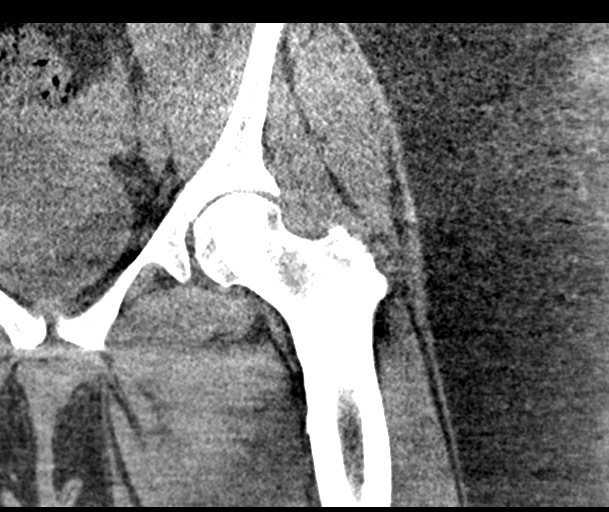
[im 70/117  soft-tissue]
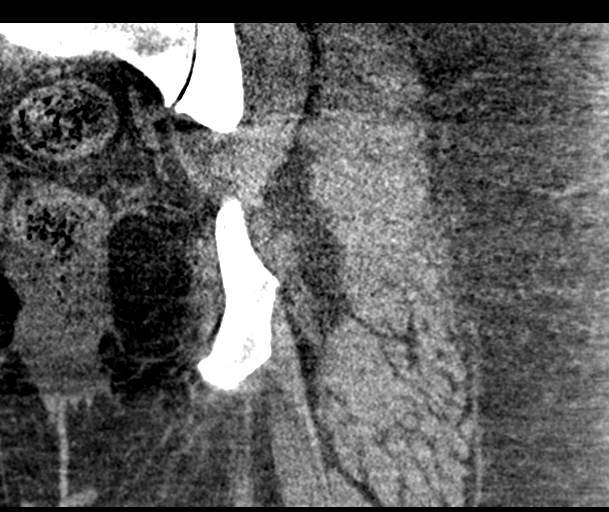

[17 of 46 positions shown; findings below may reference images not displayed]

FINDINGS: Bones/Joint/Cartilage

No fracture or dislocation of the left hip. There is ankylosis of
the left sacroiliac joint. There is sclerotic change on both sides
of the left sacroiliac joint, greater on the iliac bone.

Ligaments

Suboptimally assessed by CT.

Muscles and Tendons

Unremarkable.

Soft tissues

Visualized intraperitoneal soft tissues are unremarkable.
IMPRESSION: 1. No acute fracture or dislocation of the left hip.
2. Ankylosis of the left sacroiliac joint with sclerotic change that
may indicate osteitis condensans ilii.

## 2021-10-13 ENCOUNTER — Ambulatory Visit (INDEPENDENT_AMBULATORY_CARE_PROVIDER_SITE_OTHER): Payer: Medicare HMO | Admitting: Ophthalmology

## 2021-10-13 ENCOUNTER — Other Ambulatory Visit: Payer: Self-pay

## 2021-10-13 ENCOUNTER — Encounter (INDEPENDENT_AMBULATORY_CARE_PROVIDER_SITE_OTHER): Payer: Self-pay | Admitting: Ophthalmology

## 2021-10-13 DIAGNOSIS — I10 Essential (primary) hypertension: Secondary | ICD-10-CM | POA: Diagnosis not present

## 2021-10-13 DIAGNOSIS — H25813 Combined forms of age-related cataract, bilateral: Secondary | ICD-10-CM

## 2021-10-13 DIAGNOSIS — H35033 Hypertensive retinopathy, bilateral: Secondary | ICD-10-CM

## 2021-10-13 DIAGNOSIS — E113413 Type 2 diabetes mellitus with severe nonproliferative diabetic retinopathy with macular edema, bilateral: Secondary | ICD-10-CM

## 2021-10-13 MED ORDER — BEVACIZUMAB CHEMO INJECTION 1.25MG/0.05ML SYRINGE FOR KALEIDOSCOPE
1.2500 mg | INTRAVITREAL | Status: AC | PRN
  Administered 2021-10-13: 1.25 mg via INTRAVITREAL

## 2021-10-13 NOTE — Progress Notes (Addendum)
?[facility] Note ? ?10/13/2021 ? ?  ? ?CHIEF COMPLAINT ?Patient presents for Retina Evaluation ? ? ?HISTORY OF PRESENT ILLNESS: ?[name] is a 56 y.o. female who presents to the clinic today for:  ? ?HPI   ? ? Retina Evaluation   ?In both eyes.  I, the attending physician,  performed the HPI with the patient and updated documentation appropriately. ? ?  ?  ? ? Comments   ?Patient here for retina evaluation. Referred by Dr Katy Fitch. Patient states vision not well. Cloudy OU. Been that way since 2021. ?DM II since 2009 as pre,  2013 started Metformin for about 1 year then stopped herself.  Had 2 falls in 2020, went to the doctor and Dx Diabetic again. Now taking Trulicity and Toujeo.  BS 112 this morning A1C 6.5 (was 14 before Thanksgiving).  ? ?  ?  ?Last edited by Bernarda Caffey, MD on 10/13/2021  1:15 PM.  ?  ?Pt is here on the referral of Dr. Katy Fitch for concern of bilateral mac edema, pt saw him last week for decreased VA OU, pt is diabetic and states her blood sugar was uncontrolled for several years, current A1c is 6.5, but was 14 before Thanksgiving, pt used to see Dr. Manuella Ghazi and received 2 injections of IVE OU, last injection was 04.19.22 ? ?Referring physician: ?Debbra Riding, MD ?[address] ?[address] ?[place],  [place] [address] ? ?HISTORICAL INFORMATION:  ? ?Selected notes from the [facility] ?Referred by Dr. Katy Fitch for concern of bilateral mac edema ?LEE:  ?Ocular Hx- ?PMH- former pt of Drs Delorse Limber and Manuella Ghazi, s/p IVA OU #2 (04.19.22) ?  ? ?CURRENT MEDICATIONS: ?Current Outpatient Medications (Ophthalmic Drugs)  ?Medication Sig  ? naphazoline-pheniramine (NAPHCON-A) 0.025-0.3 % ophthalmic solution Place 2 drops into both eyes every 4 (four) hours as needed for eye irritation or allergies. (Patient not taking: Reported on 10/13/2021)  ? ?No current facility-administered medications for this visit. (Ophthalmic Drugs)  ? ?Current Outpatient Medications (Other)  ?Medication  Sig  ? atorvastatin (LIPITOR) 40 MG tablet Take 40 mg by mouth at bedtime.  ? B Complex Vitamins (B COMPLEX PO) Take 1 tablet by mouth daily.  ? citalopram (CELEXA) 40 MG tablet Take 40 mg by mouth daily.  ? Dulaglutide (TRULICITY) 3 OH/7.2BM SOPN Inject 3 mg into the skin once a week.  ? hydrochlorothiazide (HYDRODIURIL) 12.5 MG tablet Take 12.5 mg by mouth daily.  ? losartan (COZAAR) 25 MG tablet Take 25 mg by mouth daily.  ? TOUJEO SOLOSTAR 300 UNIT/ML Solostar Pen Inject 35 Units into the skin daily.  ? cetirizine (ZYRTEC) 10 MG tablet Take 1 tablet daily as needed for allergies.  May take twice daily if necessary. (Patient not taking: Reported on 10/13/2021)  ? lisinopril (ZESTRIL) 10 MG tablet Take 10 mg by mouth daily.  ? OZEMPIC, 0.25 OR 0.5 MG/DOSE, 2 MG/1.5ML SOPN Inject 0.5 mLs into the skin once a week.  ? pantoprazole (PROTONIX) 40 MG tablet Take 1 tablet (40 mg total) by mouth 2 (two) times daily.  ? ?No current facility-administered medications for this visit. (Other)  ? ? ? ? ?REVIEW OF SYSTEMS: ?ROS   ?Positive for: Endocrine, Eyes ?Negative for: Constitutional, Gastrointestinal, Neurological, Skin, Genitourinary, Musculoskeletal, HENT, Cardiovascular, Respiratory, Psychiatric, Allergic/Imm, Heme/Lymph ?Last edited by Leonie Douglas, Wil[place] on 10/13/2021  1:15 PM.  ?  ? ? ? ?ALLERGIES ?Allergies  ?Allergen Reactions  ? Pork-Derived Products Hives  ?  Tilactase Diarrhea  ?  Other reaction(s): Cough (ALLERGY/intolerance)  ? ? ?PAST MEDICAL HISTORY ?Past Medical History:  ?Diagnosis Date  ? Diabetes mellitus   ? Hypertension   ? ?Past Surgical History:  ?Procedure Laterality Date  ? BIOPSY  08/02/2020  ? Procedure: BIOPSY;  Surgeon: Otis Brace, MD;  Location: [facility];  Service: Gastroenterology;;  ? CESAREAN SECTION    ? ESOPHAGOGASTRODUODENOSCOPY (EGD) WITH PROPOFOL N/A 08/02/2020  ? Procedure: ESOPHAGOGASTRODUODENOSCOPY (EGD) WITH PROPOFOL;  Surgeon: Otis Brace, MD;  Location: [facility];   Service: Gastroenterology;  Laterality: N/A;  ? ? ?FAMILY HISTORY ?History reviewed. No pertinent family history. ? ?SOCIAL HISTORY ?Social History  ? ?Tobacco Use  ? Smoking status: Never  ? Smokeless tobacco: Never  ?Vaping Use  ? Vaping Use: Never used  ?Substance Use Topics  ? Alcohol use: No  ? Drug use: No  ? ?  ? ?  ? ?OPHTHALMIC EXAM: ? ?Base Eye Exam   ? ? Visual Acuity (Snellen - Linear)   ? ?   Right Left  ? Dist cc 20/50 20/100 -1  ? Dist ph cc 20/50 +2 20/80 -1  ? ? Correction: Glasses  ? ?  ?  ? ? Tonometry (Tonopen, 1:00 PM)   ? ?   Right Left  ? Pressure 14 14  ? ?  ?  ? ? Pupils   ? ?   Dark Light Shape React APD  ? Right 4 3 Round Minimal None  ? Left 4 3 Round Minimal None  ? ?  ?  ? ? Visual Fields (Counting fingers)   ? ?   Left Right  ?  Full Full  ? ?  ?  ? ? Extraocular Movement   ? ?   Right Left  ?  Full, Ortho Full, Ortho  ? ?  ?  ? ? Neuro/Psych   ? ? Oriented x3: Yes  ? Mood/Affect: Normal  ? ?  ?  ? ? Dilation   ? ? Both eyes: 1.0% Mydriacyl, 2.5% Phenylephrine @ 1:00 PM  ? ?  ?  ? ?  ? ?Slit Lamp and Fundus Exam   ? ? Slit Lamp Exam   ? ?   Right Left  ? Lids/Lashes Dermatochalasis - upper lid Dermatochalasis - upper lid  ? Conjunctiva/Sclera mild melanosis mild melanosis  ? Cornea trace PEE trace PEE  ? Anterior Chamber Deep and quiet Deep and quiet  ? Iris Round and dilated, No NVI Round and dilated, No NVI  ? Lens 2+ Nuclear sclerosis, 2+ Cortical cataract 2+ Nuclear sclerosis, 2+ Cortical cataract, Vacuoles  ? Anterior Vitreous Vitreous syneresis Vitreous syneresis  ? ?  ?  ? ? Fundus Exam   ? ?   Right Left  ? Disc Pink and Sharp Pink and Sharp  ? C/D Ratio 0.6 0.5  ? Macula Flat, Blunted foveal reflex, scattered MA/DBH, +edema temporal macula Blunted foveal reflex, central edema with central cluster of MA/DBH  ? Vessels attenuated, Tortuous, mild Copper wiring attenuated, Tortuous, mild Copper wiring, ?early NV along temporal arcades  ? Periphery Attached, 360 MA/DBH greatest  posteriorly, scattered punctate exudates Attached, 360 MA/DBH greatest posteriorly, scattered punctate exudates  ? ?  ?  ? ?  ? ?Refraction   ? ? Wearing Rx   ? ?   Sphere Cylinder Axis Add  ? Right -5.75 +0.50 050 +1.75  ? Left -6.25 +0.50 171 +1.75  ? ?  ?  ? ? Manifest  Refraction   ? ?   Sphere Cylinder Axis Dist VA  ? Right -6.25 Sphere  NI  ? Left -6.50 +0.50 170 20/80-  ? ?  ?  ? ?  ? ? ?IMAGING AND PROCEDURES  ?Imaging and Procedures for 10/13/2021 ? ?OCT, Retina - OU - Both Eyes   ? ?   ?Right Eye ?Quality was good. Central Foveal Thickness: 516. Progression has no prior data. Findings include abnormal foveal contour, intraretinal hyper-reflective material, intraretinal fluid, subretinal fluid, vitreomacular adhesion (Severe DME greatest centrally and temporal macula).  ? ?Left Eye ?Quality was good. Central Foveal Thickness: 561. Progression has no prior data. Findings include abnormal foveal contour, intraretinal hyper-reflective material, intraretinal fluid, subretinal fluid, vitreomacular adhesion (Severe central edema).  ? ?Notes ?*Images captured and stored on drive ? ?Diagnosis / Impression:  ?+DME OU  ?OD: Severe DME greatest centrally and temporal macula ?OS: Severe central edema ? ?Clinical management:  ?See below ? ?Abbreviations: NFP - Normal foveal profile. CME - cystoid macular edema. PED - pigment epithelial detachment. IRF - intraretinal fluid. SRF - subretinal fluid. EZ - ellipsoid zone. ERM - epiretinal membrane. ORA - outer retinal atrophy. ORT - outer retinal tubulation. SRHM - subretinal hyper-reflective material. IRHM - intraretinal hyper-reflective material ? ? ?  ? ?Intravitreal Injection, Pharmacologic Agent - OS - Left Eye   ? ?   ?Time Out ?10/13/2021. 1:31 PM. Confirmed correct patient, procedure, site, and patient consented.  ? ?Anesthesia ?Topical anesthesia was used. Anesthetic medications included Lidocaine 2%, Proparacaine 0.5%.  ? ?Procedure ?Preparation included 5% betadine  to ocular surface, eyelid speculum. A (32g) needle was used.  ? ?Injection: ?1.25 mg Bevacizumab 1.50m/0.05ml ?  Route: Intravitreal, Site: Left Eye ?  NLaguna: 29021-115-52 Lot:: [id number] Expiration date:

## 2021-11-09 NOTE — Progress Notes (Signed)
?[facility] Note ? ?11/11/2021 ? ?  ? ?CHIEF COMPLAINT ?Patient presents for Retina Follow Up ? ? ?HISTORY OF PRESENT ILLNESS: ?[name] is a 56 y.o. female who presents to the clinic today for:  ? ?HPI   ? ? Retina Follow Up   ?Patient presents with  Diabetic Retinopathy.  In both eyes.  This started 4 weeks ago.  I, the attending physician,  performed the HPI with the patient and updated documentation appropriately. ? ?  ?  ? ? 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 Patient states vision doing the same. No eye pain.  ? ?  ?  ?Last edited by Bernarda Caffey, MD on 11/13/2021 11:06 PM.  ?  ?Pt states no eye pain after first injection, the first 2 weeks she states her vision was greatly improved, but then it started to decrease again, average glucose is 144 according to her dexcom monitor, she states her PCP told her her BP was within range at her last visit ? ?Referring physician: ?Curt Jews, PA-C ?[phone] ?[address] ?[place],  [place] [address] ? ?HISTORICAL INFORMATION:  ? ?Selected notes from the [facility] ?Referred by Dr. Katy Fitch for concern of bilateral mac edema ?LEE:  ?Ocular Hx- ?PMH- former pt of Drs Delorse Limber and Manuella Ghazi, s/p IVA OU #2 (04.19.22) ?  ? ?CURRENT MEDICATIONS: ?Current Outpatient Medications (Ophthalmic Drugs)  ?Medication Sig  ? naphazoline-pheniramine (NAPHCON-A) 0.025-0.3 % ophthalmic solution Place 2 drops into both eyes every 4 (four) hours as needed for eye irritation or allergies. (Patient not taking: Reported on 10/13/2021)  ? ?No current facility-administered medications for this visit. (Ophthalmic Drugs)  ? ?Current Outpatient Medications (Other)  ?Medication Sig  ? atorvastatin (LIPITOR) 40 MG tablet Take 40 mg by mouth at bedtime.  ? B Complex Vitamins (B COMPLEX PO) Take 1 tablet by mouth daily.  ? hydrochlorothiazide (HYDRODIURIL) 12.5 MG tablet Take 12.5 mg by mouth daily.  ? losartan (COZAAR) 25 MG  tablet Take 25 mg by mouth daily.  ? TOUJEO SOLOSTAR 300 UNIT/ML Solostar Pen Inject 35 Units into the skin daily.  ? cetirizine (ZYRTEC) 10 MG tablet Take 1 tablet daily as needed for allergies.  May take twice daily if necessary. (Patient not taking: Reported on 10/13/2021)  ? citalopram (CELEXA) 40 MG tablet Take 40 mg by mouth daily.  ? Dulaglutide (TRULICITY) 3 IO/0.3TD SOPN Inject 3 mg into the skin once a week.  ? lisinopril (ZESTRIL) 10 MG tablet Take 10 mg by mouth daily.  ? OZEMPIC, 0.25 OR 0.5 MG/DOSE, 2 MG/1.5ML SOPN Inject 0.5 mLs into the skin once a week.  ? pantoprazole (PROTONIX) 40 MG tablet Take 1 tablet (40 mg total) by mouth 2 (two) times daily.  ? ?No current facility-administered medications for this visit. (Other)  ? ?REVIEW OF SYSTEMS: ?ROS   ?Positive for: Endocrine, Eyes ?Negative for: Constitutional, Gastrointestinal, Neurological, Skin, Genitourinary, Musculoskeletal, HENT, Cardiovascular, Respiratory, Psychiatric, Allergic/Imm, Heme/Lymph ?Last edited by Theodore Demark, COA on 11/11/2021  1:20 PM.  ?  ? ?ALLERGIES ?Allergies  ?Allergen Reactions  ? Pork-Derived Products Hives  ? Tilactase Diarrhea  ?  Other reaction(s): Cough (ALLERGY/intolerance)  ? ?PAST MEDICAL HISTORY ?Past Medical History:  ?Diagnosis Date  ? Diabetes mellitus   ? Hypertension   ? ?Past Surgical History:  ?Procedure Laterality Date  ? BIOPSY  08/02/2020  ? Procedure: BIOPSY;  Surgeon: Otis Brace,  MD;  Location: [facility];  Service: Gastroenterology;;  ? CESAREAN SECTION    ? ESOPHAGOGASTRODUODENOSCOPY (EGD) WITH PROPOFOL N/A 08/02/2020  ? Procedure: ESOPHAGOGASTRODUODENOSCOPY (EGD) WITH PROPOFOL;  Surgeon: Otis Brace, MD;  Location: [facility];  Service: Gastroenterology;  Laterality: N/A;  ? ?FAMILY HISTORY ?History reviewed. No pertinent family history. ? ?SOCIAL HISTORY ?Social History  ? ?Tobacco Use  ? Smoking status: Never  ? Smokeless tobacco: Never  ?Vaping Use  ? Vaping Use: Never used   ?Substance Use Topics  ? Alcohol use: No  ? Drug use: No  ?  ? ?  ?OPHTHALMIC EXAM: ? ?Base Eye Exam   ? ? Visual Acuity (Snellen - Linear)   ? ?   Right Left  ? Dist cc 20/50 -2 20/80 -1  ? Dist ph cc NI NI  ? ? Correction: Glasses  ? ?  ?  ? ? Tonometry (Tonopen, 1:18 PM)   ? ?   Right Left  ? Pressure 14 15  ? ?  ?  ? ? Pupils   ? ?   Dark Light Shape React APD  ? Right 4 3 Round Minimal None  ? Left 4 3 Round Minimal None  ? ?  ?  ? ? Visual Fields (Counting fingers)   ? ?   Left Right  ?  Full Full  ? ?  ?  ? ? Extraocular Movement   ? ?   Right Left  ?  Full, Ortho Full, Ortho  ? ?  ?  ? ? Neuro/Psych   ? ? Oriented x3: Yes  ? Mood/Affect: Normal  ? ?  ?  ? ? Dilation   ? ? Both eyes: 1.0% Mydriacyl, 2.5% Phenylephrine @ 1:18 PM  ? ?  ?  ? ?  ? ?Slit Lamp and Fundus Exam   ? ? Slit Lamp Exam   ? ?   Right Left  ? Lids/Lashes Dermatochalasis - upper lid Dermatochalasis - upper lid  ? Conjunctiva/Sclera mild melanosis mild melanosis  ? Cornea trace PEE trace PEE  ? Anterior Chamber Deep and quiet Deep and quiet  ? Iris Round and dilated, No NVI Round and dilated, No NVI  ? Lens 2+ Nuclear sclerosis, 2+ Cortical cataract 2+ Nuclear sclerosis, 2+ Cortical cataract, Vacuoles  ? Anterior Vitreous Vitreous syneresis Vitreous syneresis  ? ?  ?  ? ? Fundus Exam   ? ?   Right Left  ? Disc Pink and Sharp Pink and Sharp  ? C/D Ratio 0.6 0.5  ? Macula Flat, Blunted foveal reflex, scattered MA/DBH, +edema temporal macula -- slightly improved Blunted foveal reflex, central edema with central cluster of MA/DBH -- slightly increased  ? Vessels attenuated, Tortuous attenuated, Tortuous, ?early NV along temporal arcades  ? Periphery Attached, 360 MA/DBH greatest posteriorly, scattered punctate exudates - improved Attached, 360 MA/DBH greatest posteriorly, scattered punctate exudates - improved  ? ?  ?  ? ?  ? ?Refraction   ? ? Wearing Rx   ? ?   Sphere Cylinder Axis Add  ? Right -5.75 +0.50 050 +1.75  ? Left -6.25 +0.50 171  +1.75  ? ?  ?  ? ?  ? ? ?IMAGING AND PROCEDURES  ?Imaging and Procedures for 11/11/2021 ? ?OCT, Retina - OU - Both Eyes   ? ?   ?Right Eye ?Quality was good. Central Foveal Thickness: 450. Progression has improved. Findings include abnormal foveal contour, intraretinal hyper-reflective material, intraretinal fluid, subretinal fluid, vitreomacular adhesion (Mild  interval improvement in IRF/SRF, +VMA).  ? ?Left Eye ?Quality was good. Central Foveal Thickness: 595. Progression has been stable. Findings include abnormal foveal contour, intraretinal hyper-reflective material, intraretinal fluid, subretinal fluid, vitreomacular adhesion (Persistent IRF/SRF -- slightly increased).  ? ?Notes ?*Images captured and stored on drive ? ?Diagnosis / Impression:  ?+DME OU  ?OD: Mild interval improvement in IRF/SRF, +VMA ?OS: Persistent IRF/SRF -- slightly increased ? ?Clinical management:  ?See below ? ?Abbreviations: NFP - Normal foveal profile. CME - cystoid macular edema. PED - pigment epithelial detachment. IRF - intraretinal fluid. SRF - subretinal fluid. EZ - ellipsoid zone. ERM - epiretinal membrane. ORA - outer retinal atrophy. ORT - outer retinal tubulation. SRHM - subretinal hyper-reflective material. IRHM - intraretinal hyper-reflective material ? ? ?  ? ?Intravitreal Injection, Pharmacologic Agent - OD - Right Eye   ? ?   ?Time Out ?11/11/2021. 1:55 PM. Confirmed correct patient, procedure, site, and patient consented.  ? ?Anesthesia ?Topical anesthesia was used. Anesthetic medications included Lidocaine 2%, Proparacaine 0.5%.  ? ?Procedure ?Preparation included 5% betadine to ocular surface, eyelid speculum. A supplied needle was used.  ? ?Injection: ?1.25 mg Bevacizumab 1.66m/0.05ml ?  Route: Intravitreal, Site: Right Eye ?  NDC: 501314-388-87 Lot: [id number], Expiration date: 12/07/2021  ? ?Post-op ?Post injection exam found visual acuity of at least counting fingers. The patient tolerated the procedure well. There  were no complications. The patient received written and verbal post procedure care education. Post injection medications were not given.  ? ?  ? ?Intravitreal Injection, Pharmacologic Agent - OS - Left Eye   ? ?

## 2021-11-11 ENCOUNTER — Encounter (INDEPENDENT_AMBULATORY_CARE_PROVIDER_SITE_OTHER): Payer: Self-pay | Admitting: Ophthalmology

## 2021-11-11 ENCOUNTER — Ambulatory Visit (INDEPENDENT_AMBULATORY_CARE_PROVIDER_SITE_OTHER): Payer: Medicare HMO | Admitting: Ophthalmology

## 2021-11-11 DIAGNOSIS — E113413 Type 2 diabetes mellitus with severe nonproliferative diabetic retinopathy with macular edema, bilateral: Secondary | ICD-10-CM

## 2021-11-11 DIAGNOSIS — H25813 Combined forms of age-related cataract, bilateral: Secondary | ICD-10-CM | POA: Diagnosis not present

## 2021-11-11 DIAGNOSIS — H35033 Hypertensive retinopathy, bilateral: Secondary | ICD-10-CM | POA: Diagnosis not present

## 2021-11-11 DIAGNOSIS — I10 Essential (primary) hypertension: Secondary | ICD-10-CM

## 2021-11-13 ENCOUNTER — Encounter (INDEPENDENT_AMBULATORY_CARE_PROVIDER_SITE_OTHER): Payer: Self-pay | Admitting: Ophthalmology

## 2021-11-13 MED ORDER — BEVACIZUMAB CHEMO INJECTION 1.25MG/0.05ML SYRINGE FOR KALEIDOSCOPE
1.2500 mg | INTRAVITREAL | Status: AC | PRN
  Administered 2021-11-13: 1.25 mg via INTRAVITREAL

## 2021-11-29 NOTE — Progress Notes (Signed)
?[facility] Note ? ?12/09/2021 ? ?  ? ?CHIEF COMPLAINT ?Patient presents for Retina Follow Up ? ? ?HISTORY OF PRESENT ILLNESS: ?[name] is a 56 y.o. female who presents to the clinic today for:  ? ?HPI   ? ? Retina Follow Up   ?Patient presents with  Diabetic Retinopathy.  In both eyes.  This started 4 weeks ago.  I, the attending physician,  performed the HPI with the patient and updated documentation appropriately. ? ?  ?  ? ? 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 Patient states vision about the same. No eye pain. Cymbalta was added. ? ?  ?  ?Last edited by Bernarda Caffey, MD on 12/09/2021 11:39 PM.  ?  ?Pt states vision OS has decreased, pt states BP is "very nice" ? ?Referring physician: ?Curt Jews, PA-C ?[phone] ?[address] ?[place],   [id number] ? ?HISTORICAL INFORMATION:  ? ?Selected notes from the [place] ?Referred by Dr. Katy Fitch for concern of bilateral mac edema ?LEE:  ?Ocular Hx- ?PMH- former pt of Drs Delorse Limber and Manuella Ghazi, s/p IVA OU #2 (04.19.22) ?  ? ?CURRENT MEDICATIONS: ?Current Outpatient Medications (Ophthalmic Drugs)  ?Medication Sig  ? naphazoline-pheniramine (NAPHCON-A) 0.025-0.3 % ophthalmic solution Place 2 drops into both eyes every 4 (four) hours as needed for eye irritation or allergies. (Patient not taking: Reported on 10/13/2021)  ? ?No current facility-administered medications for this visit. (Ophthalmic Drugs)  ? ?Current Outpatient Medications (Other)  ?Medication Sig  ? atorvastatin (LIPITOR) 40 MG tablet Take 40 mg by mouth at bedtime.  ? B Complex Vitamins (B COMPLEX PO) Take 1 tablet by mouth daily.  ? hydrochlorothiazide (HYDRODIURIL) 12.5 MG tablet Take 12.5 mg by mouth daily.  ? losartan (COZAAR) 25 MG tablet Take 25 mg by mouth daily.  ? TOUJEO SOLOSTAR 300 UNIT/ML Solostar Pen Inject 35 Units into the skin daily.  ? cetirizine (ZYRTEC) 10 MG tablet Take 1 tablet daily as needed for  allergies.  May take twice daily if necessary. (Patient not taking: Reported on 10/13/2021)  ? citalopram (CELEXA) 40 MG tablet Take 40 mg by mouth daily.  ? Dulaglutide (TRULICITY) 3 WE/9.9BZ SOPN Inject 3 mg into the skin once a week.  ? lisinopril (ZESTRIL) 10 MG tablet Take 10 mg by mouth daily.  ? OZEMPIC, 0.25 OR 0.5 MG/DOSE, 2 MG/1.5ML SOPN Inject 0.5 mLs into the skin once a week.  ? pantoprazole (PROTONIX) 40 MG tablet Take 1 tablet (40 mg total) by mouth 2 (two) times daily.  ? ?No current facility-administered medications for this visit. (Other)  ? ?REVIEW OF SYSTEMS: ?ROS   ?Positive for: Endocrine, Eyes ?Negative for: Constitutional, Gastrointestinal, Neurological, Skin, Genitourinary, Musculoskeletal, HENT, Cardiovascular, Respiratory, Psychiatric, Allergic/Imm, Heme/Lymph ?Last edited by Theodore Demark, COA on 12/09/2021 12:42 PM.  ?  ? ? ?ALLERGIES ?Allergies  ?Allergen Reactions  ? Pork-Derived Products Hives  ? Tilactase Diarrhea  ?  Other reaction(s): Cough (ALLERGY/intolerance)  ? ?PAST MEDICAL HISTORY ?Past Medical History:  ?Diagnosis Date  ? Diabetes mellitus   ? Hypertension   ? ?Past Surgical History:  ?Procedure Laterality Date  ? BIOPSY  08/02/2020  ? Procedure: BIOPSY;  Surgeon: Otis Brace, MD;  Location: [facility];  Service: Gastroenterology;;  ? CESAREAN SECTION    ? ESOPHAGOGASTRODUODENOSCOPY (EGD) WITH PROPOFOL N/A 08/02/2020  ? Procedure: ESOPHAGOGASTRODUODENOSCOPY (EGD) WITH PROPOFOL;  Surgeon: Otis Brace, MD;  Location:  [facility];  Service: Gastroenterology;  Laterality: N/A;  ? ?FAMILY HISTORY ?History reviewed. No pertinent family history. ? ?SOCIAL HISTORY ?Social History  ? ?Tobacco Use  ? Smoking status: Never  ? Smokeless tobacco: Never  ?Vaping Use  ? Vaping Use: Never used  ?Substance Use Topics  ? Alcohol use: No  ? Drug use: No  ?  ? ?  ?OPHTHALMIC EXAM: ? ?Base Eye Exam   ? ? Visual Acuity (Snellen - Linear)   ? ?   Right Left  ? Dist cc 20/40 20/100 -2   ? Dist ph cc NI NI  ? ? Correction: Glasses  ? ?  ?  ? ? Tonometry (Tonopen, 12:40 PM)   ? ?   Right Left  ? Pressure 17 17  ? ?  ?  ? ? Pupils   ? ?   Dark Light Shape React APD  ? Right 4 3 Round Minimal None  ? Left 4 3 Round Minimal None  ? ?  ?  ? ? Visual Fields (Counting fingers)   ? ?   Left Right  ?  Full Full  ? ?  ?  ? ? Extraocular Movement   ? ?   Right Left  ?  Full, Ortho Full, Ortho  ? ?  ?  ? ? Neuro/Psych   ? ? Oriented x3: Yes  ? Mood/Affect: Normal  ? ?  ?  ? ? Dilation   ? ? Both eyes: 1.0% Mydriacyl, 2.5% Phenylephrine @ 12:40 PM  ? ?  ?  ? ?  ? ?Slit Lamp and Fundus Exam   ? ? Slit Lamp Exam   ? ?   Right Left  ? Lids/Lashes Dermatochalasis - upper lid Dermatochalasis - upper lid  ? Conjunctiva/Sclera mild melanosis mild melanosis  ? Cornea trace PEE trace PEE  ? Anterior Chamber Deep and quiet Deep and quiet  ? Iris Round and dilated, No NVI Round and dilated, No NVI  ? Lens 2+ Nuclear sclerosis, 2+ Cortical cataract 2+ Nuclear sclerosis, 2+ Cortical cataract, Vacuoles  ? Anterior Vitreous Vitreous syneresis Vitreous syneresis  ? ?  ?  ? ? Fundus Exam   ? ?   Right Left  ? Disc Pink and Sharp Pink and Sharp  ? C/D Ratio 0.6 0.5  ? Macula Flat, Blunted foveal reflex, scattered MA/DBH, +edema temporal macula -- slightly improved Blunted foveal reflex, central edema with central cluster of MA/DBH -- slightly increased  ? Vessels attenuated, Tortuous attenuated, Tortuous  ? Periphery Attached, 360 MA/DBH greatest posteriorly, scattered punctate exudates - improved Attached, 360 MA/DBH greatest posteriorly, scattered punctate exudates - improved  ? ?  ?  ? ?  ? ?Refraction   ? ? Wearing Rx   ? ?   Sphere Cylinder Axis Add  ? Right -5.75 +0.50 050 +1.75  ? Left -6.25 +0.50 171 +1.75  ? ?  ?  ? ?  ? ? ?IMAGING AND PROCEDURES  ?Imaging and Procedures for 12/09/2021 ? ?OCT, Retina - OU - Both Eyes   ? ?   ?Right Eye ?Quality was good. Central Foveal Thickness: 49. Progression has improved. Findings  include abnormal foveal contour, intraretinal hyper-reflective material, intraretinal fluid, subretinal fluid, vitreomacular adhesion (interval improvement in IRF/IRHM greatest temporal macula and fovea, +VMA).  ? ?Left Eye ?Quality was good. Central Foveal Thickness: 705. Progression has worsened. Findings include abnormal foveal contour, intraretinal hyper-reflective material, intraretinal fluid, subretinal fluid, vitreomacular adhesion (Mild interval increase in IRF/SRF  centrally).  ? ?Notes ?*Images captured and stored on drive ? ?Diagnosis / Impression:  ?+DME OU  ?OD: interval improvement in IRF/IRHM greatest temporal macula and fovea, +VMA ?OS: Mild interval increase in IRF/SRF centrally ? ?Clinical management:  ?See below ? ?Abbreviations: NFP - Normal foveal profile. CME - cystoid macular edema. PED - pigment epithelial detachment. IRF - intraretinal fluid. SRF - subretinal fluid. EZ - ellipsoid zone. ERM - epiretinal membrane. ORA - outer retinal atrophy. ORT - outer retinal tubulation. SRHM - subretinal hyper-reflective material. IRHM - intraretinal hyper-reflective material ? ? ?  ? ?Fluorescein Angiography Optos (Transit OS)   ? ?   ?Right Eye ?Early phase findings include microaneurysm, vascular perfusion defect. Mid/Late phase findings include leakage, microaneurysm, vascular perfusion defect (No NV, leaking MA concentrated temporal to fovea, mild perivascular leakage temporally).  ? ?Left Eye ?Early phase findings include microaneurysm, vascular perfusion defect. Mid/Late phase findings include microaneurysm, leakage, vascular perfusion defect (No NV, leaking MA concentrated centrally, mild perivascular leakage temporally).  ? ?Notes ?*Images captured and stored on drive ? ?Diagnosis / Impression:  ?Severe NPDR OU ?OD: No NV, leaking MA concentrated temporal to fovea, mild perivascular leakage temporally ?OS: No NV, leaking MA concentrated centrally, mild perivascular leakage temporally ? ?Clinical  management:  ?See below ? ?Abbreviations: NFP - Normal foveal profile. CME - cystoid macular edema. PED - pigment epithelial detachment. IRF - intraretinal fluid. SRF - subretinal fluid. EZ - ellipsoid zone. ERM

## 2021-12-09 ENCOUNTER — Encounter (INDEPENDENT_AMBULATORY_CARE_PROVIDER_SITE_OTHER): Payer: Self-pay | Admitting: Ophthalmology

## 2021-12-09 ENCOUNTER — Ambulatory Visit (INDEPENDENT_AMBULATORY_CARE_PROVIDER_SITE_OTHER): Payer: Medicare HMO | Admitting: Ophthalmology

## 2021-12-09 DIAGNOSIS — H35033 Hypertensive retinopathy, bilateral: Secondary | ICD-10-CM | POA: Diagnosis not present

## 2021-12-09 DIAGNOSIS — H25813 Combined forms of age-related cataract, bilateral: Secondary | ICD-10-CM

## 2021-12-09 DIAGNOSIS — E113413 Type 2 diabetes mellitus with severe nonproliferative diabetic retinopathy with macular edema, bilateral: Secondary | ICD-10-CM | POA: Diagnosis not present

## 2021-12-09 DIAGNOSIS — I10 Essential (primary) hypertension: Secondary | ICD-10-CM

## 2021-12-09 MED ORDER — BEVACIZUMAB CHEMO INJECTION 1.25MG/0.05ML SYRINGE FOR KALEIDOSCOPE
1.2500 mg | INTRAVITREAL | Status: AC | PRN
  Administered 2021-12-09: 1.25 mg via INTRAVITREAL

## 2022-01-03 NOTE — Progress Notes (Signed)
[facility] Note  01/06/2022     CHIEF COMPLAINT Patient presents for Retina Follow Up   HISTORY OF PRESENT ILLNESS: [name] is a 56 y.o. female who presents to the clinic today for:   HPI     Retina Follow Up   Patient presents with  Diabetic Retinopathy (IVA OU #3 (05.12.23)).  In both eyes.  This started months ago.  Duration of 4 weeks.  Since onset it is stable.  I, the attending physician,  performed the HPI with the patient and updated documentation appropriately.        Comments   The vision starts to decrease at week number three. Her blood sugar was 195 yesterday and her A1C is 6.5.      Last edited by Bernarda Caffey, MD on 01/06/2022  2:32 PM.    Pt states she is unable to get a dexcom, she states her blood sugar numbers have been "going so high", but she doesn't have anything to wake her up to let her know, she has not been sleeping well bc of this, she feels like her left eye vision was worse today when reading the eye chart  Referring physician: Curt Jews, PA-C [id number] [place],  [place] [address]  HISTORICAL INFORMATION:   Selected notes from the MEDICAL RECORD NUMBER Referred by Dr. Katy Fitch for concern of bilateral mac edema LEE:  Ocular Hx- PMH- former pt of Drs Delorse Limber and Manuella Ghazi, s/p IVA OU #2 (04.19.22)    CURRENT MEDICATIONS: Current Outpatient Medications (Ophthalmic Drugs)  Medication Sig   naphazoline-pheniramine (NAPHCON-A) 0.025-0.3 % ophthalmic solution Place 2 drops into both eyes every 4 (four) hours as needed for eye irritation or allergies.   No current facility-administered medications for this visit. (Ophthalmic Drugs)   Current Outpatient Medications (Other)  Medication Sig   atorvastatin (LIPITOR) 40 MG tablet Take 40 mg by mouth at bedtime.   B Complex Vitamins (B COMPLEX PO) Take 1 tablet by mouth daily.   hydrochlorothiazide (HYDRODIURIL) 12.5 MG tablet Take 12.5 mg by mouth  daily.   losartan (COZAAR) 25 MG tablet Take 25 mg by mouth daily.   OZEMPIC, 0.25 OR 0.5 MG/DOSE, 2 MG/1.5ML SOPN Inject 0.5 mLs into the skin once a week.   TOUJEO SOLOSTAR 300 UNIT/ML Solostar Pen Inject 35 Units into the skin daily.   cetirizine (ZYRTEC) 10 MG tablet Take 1 tablet daily as needed for allergies.  May take twice daily if necessary.   citalopram (CELEXA) 40 MG tablet Take 40 mg by mouth daily.   Dulaglutide (TRULICITY) 3 LO/7.5IE SOPN Inject 3 mg into the skin once a week.   lisinopril (ZESTRIL) 10 MG tablet Take 10 mg by mouth daily.   pantoprazole (PROTONIX) 40 MG tablet Take 1 tablet (40 mg total) by mouth 2 (two) times daily.   No current facility-administered medications for this visit. (Other)   REVIEW OF SYSTEMS: ROS   Positive for: Endocrine, Eyes Negative for: Constitutional, Gastrointestinal, Neurological, Skin, Genitourinary, Musculoskeletal, HENT, Cardiovascular, Respiratory, Psychiatric, Allergic/Imm, Heme/Lymph Last edited by Annie Paras, COT on 01/06/2022  8:53 AM.     ALLERGIES Allergies  Allergen Reactions   Pork-Derived Products Hives   Tilactase Diarrhea    Other reaction(s): Cough (ALLERGY/intolerance)   PAST MEDICAL HISTORY Past Medical History:  Diagnosis Date   Diabetes mellitus    Hypertension    Past Surgical History:  Procedure Laterality Date   BIOPSY  08/02/2020   Procedure: BIOPSY;  Surgeon: Otis Brace, MD;  Location: [facility];  Service: Gastroenterology;;   CESAREAN SECTION     ESOPHAGOGASTRODUODENOSCOPY (EGD) WITH PROPOFOL N/A 08/02/2020   Procedure: ESOPHAGOGASTRODUODENOSCOPY (EGD) WITH PROPOFOL;  Surgeon: Otis Brace, MD;  Location: [facility];  Service: Gastroenterology;  Laterality: N/A;   FAMILY HISTORY History reviewed. No pertinent family history.  SOCIAL HISTORY Social History   Tobacco Use   Smoking status: Never   Smokeless tobacco: Never  Vaping Use   Vaping Use: Never used  Substance  Use Topics   Alcohol use: No   Drug use: No       OPHTHALMIC EXAM:  Base Eye Exam     Visual Acuity (Snellen - Linear)       Right Left   Dist cc 20/30 20/80   Dist ph cc NI NI    Correction: Glasses         Tonometry (Tonopen, 8:58 AM)       Right Left   Pressure 17 15         Pupils       Dark Light Shape React APD   Right 4 3 Round Minimal None   Left 4 3 Round Minimal None         Visual Fields       Left Right    Full Full         Extraocular Movement       Right Left    Full, Ortho Full, Ortho         Neuro/Psych     Oriented x3: Yes   Mood/Affect: Normal         Dilation     Both eyes: 1.0% Mydriacyl, 2.5% Phenylephrine @ 8:55 AM           Slit Lamp and Fundus Exam     Slit Lamp Exam       Right Left   Lids/Lashes Dermatochalasis - upper lid Dermatochalasis - upper lid   Conjunctiva/Sclera mild melanosis mild melanosis   Cornea trace PEE trace PEE   Anterior Chamber Deep and quiet Deep and quiet   Iris Round and dilated, No NVI Round and dilated, No NVI   Lens 2+ Nuclear sclerosis, 2+ Cortical cataract 2+ Nuclear sclerosis, 2+ Cortical cataract, Vacuoles   Anterior Vitreous Vitreous syneresis Vitreous syneresis         Fundus Exam       Right Left   Disc Mild pallor, Sharp rim Mild pallor, Sharp rim   C/D Ratio 0.6 0.5   Macula Flat, Blunted foveal reflex, scattered MA/DBH, +edema temporal macula -- slightly increased Blunted foveal reflex, central edema with central cluster of MA/DBH -- slightly increased   Vessels attenuated, Tortuous attenuated, Tortuous   Periphery Attached, 360 MA/DBH greatest posteriorly, scattered punctate exudates - improved Attached, 360 MA/DBH greatest posteriorly, scattered punctate exudates - improved           Refraction     Wearing Rx       Sphere Cylinder Axis Add   Right -5.75 +0.50 050 +1.75   Left -6.25 +0.50 171 +1.75           IMAGING AND PROCEDURES  Imaging  and Procedures for 01/06/2022  OCT, Retina - OU - Both Eyes       Right Eye Quality was good. Central Foveal Thickness: 427. Progression has worsened. Findings include abnormal foveal contour, intraretinal hyper-reflective material, intraretinal fluid, subretinal fluid, vitreomacular adhesion (  Mild interval increase in IRF/IRHM greatest temporal macula and fovea, +VMA).   Left Eye Quality was good. Central Foveal Thickness: 776. Progression has worsened. Findings include abnormal foveal contour, intraretinal hyper-reflective material, intraretinal fluid, subretinal fluid, vitreomacular adhesion (Interval increase in IRF/SRF, VMA).   Notes *Images captured and stored on drive  Diagnosis / Impression:  +DME OU  OD: Mild interval increase in IRF/IRHM greatest temporal macula and fovea, +VMA OS: Interval increase in IRF/SRF, VMA  Clinical management:  See below  Abbreviations: NFP - Normal foveal profile. CME - cystoid macular edema. PED - pigment epithelial detachment. IRF - intraretinal fluid. SRF - subretinal fluid. EZ - ellipsoid zone. ERM - epiretinal membrane. ORA - outer retinal atrophy. ORT - outer retinal tubulation. SRHM - subretinal hyper-reflective material. IRHM - intraretinal hyper-reflective material      Intravitreal Injection, Pharmacologic Agent - OD - Right Eye       Time Out 01/06/2022. 10:00 AM. Confirmed correct patient, procedure, site, and patient consented.   Anesthesia Topical anesthesia was used. Anesthetic medications included Lidocaine 2%, Proparacaine 0.5%.   Procedure Preparation included 5% betadine to ocular surface, eyelid speculum. A supplied needle was used.   Injection: 1.25 mg Bevacizumab 1.41m/0.05ml   Route: Intravitreal, Site: Right Eye   NDC: 50242-060-01, Lot:: [id number] Expiration date: 02/25/2022   Post-op Post injection exam found visual acuity of at least counting fingers. The patient tolerated the procedure well. There were no  complications. The patient received written and verbal post procedure care education. Post injection medications were not given.      Intravitreal Injection, Pharmacologic Agent - OS - Left Eye       Time Out 01/06/2022. 10:00 AM. Confirmed correct patient, procedure, site, and patient consented.   Anesthesia Topical anesthesia was used. Anesthetic medications included Lidocaine 2%, Proparacaine 0.5%.   Procedure Preparation included 5% betadine to ocular surface, eyelid speculum. A (32g) needle was used.   Injection: 1.25 mg Bevacizumab 1.220m0.05ml   Route: Intravitreal, Site: Left Eye   NDC: : 60109-323-55Lot: : [id number] date: 02/25/2022   Post-op Post injection exam found visual acuity of at least counting fingers. The patient tolerated the procedure well. There were no complications. The patient received written and verbal post procedure care education.            ASSESSMENT/PLAN:    ICD-10-CM   1. 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 Essential hypertension  I10     3. Hypertensive retinopathy of both eyes  H35.033     4. Combined forms of age-related cataract of both eyes  H25.813      Severe Non-proliferative diabetic retinopathy, both eyes  - formerly managed by Drs TeDelorse Limbernd ShManuella Ghazireceived IVE OU x2 with Dr. ShManuella Ghazilast one 04.19.22 -- was lost to retina follow up since - exam shows scattered MA/DBH OU - s/p IVA OU #1 (03.16.23), #2 (04.14.23), #3 (05.12.23) - BCVA 20/30 OD, 20/80 OS -- improved OU - OCT shows OD: Mild interval increase in IRF/IRHM greatest temporal macula and fovea, +VMA; OS: Interval increase in  IRF/SRF, VMA - FA (05.12.23) shows OD: No NV, leaking  MA concentrated temporal to fovea, mild perivascular leakage temporally; OS: No NV, leaking MA concentrated centrally, mild perivascular leakage temporally - recommend IVA OU #4 today, 06.09.23 for severe DME OU - pt wishes to proceed with injections - RBA of procedure discussed, questions answered - IVA informed consent obtained and signed, 03.16.23 (OU) - see procedure note - f/u in 4 wks -- DFE/OCT/possible injection  2,3. Hypertensive retinopathy OU - discussed importance of tight BP control - monitor    4. Mixed Cataract OU - The symptoms of cataract, surgical options, and treatments and risks were discussed with patient. - discussed diagnosis and progression - monitor for now   Ophthalmic Meds Ordered this visit:  Meds ordered this encounter  Medications   Bevacizumab (AVASTIN) SOLN 1.25 mg   Bevacizumab (AVASTIN) SOLN 1.25 mg     Return in about 4 weeks (around 02/03/2022) for f/u NPDR OU, DFE, OCT.  There are no Patient Instructions on file for this visit.   Explained the diagnoses, plan, and follow up with the patient and they expressed understanding.  Patient expressed understanding of the importance of proper follow up care.   This document serves as a record of services personally performed by Gardiner Sleeper, MD, PhD. It was created on their behalf by Leonie Douglas, an ophthalmic technician. The creation of this record is the provider's dictation and/or activities during the visit.    Electronically signed by: Leonie Douglas COA, 01/06/22  3:21 PM  Gardiner Sleeper, M.D., Ph.D. Diseases & Surgery of the Retina and Vitreous [facility]  I have reviewed the above documentation for accuracy and completeness, and I agree with the above. Gardiner Sleeper, M.D., Ph.D. 01/06/22 3:21 PM  Abbreviations: M myopia (nearsighted); A astigmatism; H hyperopia (farsighted); P presbyopia; Mrx spectacle  prescription;  CTL contact lenses; OD right eye; OS left eye; OU both eyes  XT exotropia; ET esotropia; PEK punctate epithelial keratitis; PEE punctate epithelial erosions; DES dry eye syndrome; MGD meibomian gland dysfunction; ATs artificial tears; PFAT's preservative free artificial tears; [place] nuclear sclerotic cataract; PSC posterior subcapsular cataract; ERM epi-retinal membrane; PVD posterior vitreous detachment; RD retinal detachment; DM diabetes mellitus; DR diabetic retinopathy; NPDR non-proliferative diabetic retinopathy; PDR proliferative diabetic retinopathy; CSME clinically significant macular edema; DME diabetic macular edema; dbh dot blot hemorrhages; CWS cotton wool spot; POAG primary open angle glaucoma; C/D cup-to-disc ratio; HVF humphrey visual field; GVF goldmann visual field; OCT optical coherence tomography; IOP intraocular pressure; BRVO Branch retinal vein occlusion; CRVO central retinal vein occlusion; CRAO central retinal artery occlusion; BRAO branch retinal artery occlusion; RT retinal tear; SB scleral buckle; PPV pars plana vitrectomy; VH Vitreous hemorrhage; PRP panretinal laser photocoagulation; IVK intravitreal kenalog; VMT vitreomacular traction; MH Macular hole;  NVD neovascularization of the disc; NVE neovascularization elsewhere; AREDS age related eye disease study; ARMD age related macular degeneration; POAG primary open angle glaucoma; EBMD epithelial/anterior basement membrane dystrophy; ACIOL anterior chamber intraocular lens; IOL intraocular lens; PCIOL posterior chamber intraocular lens; Phaco/IOL phacoemulsification with intraocular lens placement; [place] photorefractive keratectomy; LASIK laser assisted in situ keratomileusis; HTN hypertension; DM diabetes mellitus; COPD chronic obstructive pulmonary disease

## 2022-01-06 ENCOUNTER — Ambulatory Visit (INDEPENDENT_AMBULATORY_CARE_PROVIDER_SITE_OTHER): Payer: Medicare HMO | Admitting: Ophthalmology

## 2022-01-06 ENCOUNTER — Encounter (INDEPENDENT_AMBULATORY_CARE_PROVIDER_SITE_OTHER): Payer: Self-pay | Admitting: Ophthalmology

## 2022-01-06 DIAGNOSIS — H35033 Hypertensive retinopathy, bilateral: Secondary | ICD-10-CM | POA: Diagnosis not present

## 2022-01-06 DIAGNOSIS — I10 Essential (primary) hypertension: Secondary | ICD-10-CM | POA: Diagnosis not present

## 2022-01-06 DIAGNOSIS — H25813 Combined forms of age-related cataract, bilateral: Secondary | ICD-10-CM

## 2022-01-06 DIAGNOSIS — E113413 Type 2 diabetes mellitus with severe nonproliferative diabetic retinopathy with macular edema, bilateral: Secondary | ICD-10-CM

## 2022-01-06 MED ORDER — BEVACIZUMAB CHEMO INJECTION 1.25MG/0.05ML SYRINGE FOR KALEIDOSCOPE
1.2500 mg | INTRAVITREAL | Status: AC | PRN
  Administered 2022-01-06: 1.25 mg via INTRAVITREAL

## 2022-02-01 NOTE — Progress Notes (Signed)
[facility] Note  02/03/2022     CHIEF COMPLAINT Patient presents for Retina Follow Up   HISTORY OF PRESENT ILLNESS: [name] is a 56 y.o. female who presents to the clinic today for:   HPI     Retina Follow Up   Patient presents with  Diabetic Retinopathy.  In both eyes.  This started 4 weeks ago.  I, the attending physician,  performed the HPI with the patient and updated documentation appropriately.        Comments   Patient here for 4 weeks retina follow up for NPDR OU. Patient states vision OS had pain after the injection. Got cloudiness like need to cleans glasses. OD is fine. OD is compensating for OS. OS could feel the needle in it. Normally doesn't feel them. Added new med for depression ventaloxine. Taken off cymbalta.      Last edited by Bernarda Caffey, MD on 02/03/2022  3:31 PM.     Pt states right eye vision seems to be doing well, she had pain in OS after the last injection, she feels like she has had a "film" over her left eye for the past week  Referring physician: Curt Jews, PA-C [id number] [place],  [place] [address]  HISTORICAL INFORMATION:   Selected notes from the MEDICAL RECORD NUMBER Referred by Dr. Katy Fitch for concern of bilateral mac edema LEE:  Ocular Hx- PMH- former pt of Drs Delorse Limber and Manuella Ghazi, s/p IVA OU #2 (04.19.22)    CURRENT MEDICATIONS: Current Outpatient Medications (Ophthalmic Drugs)  Medication Sig   naphazoline-pheniramine (NAPHCON-A) 0.025-0.3 % ophthalmic solution Place 2 drops into both eyes every 4 (four) hours as needed for eye irritation or allergies.   No current facility-administered medications for this visit. (Ophthalmic Drugs)   Current Outpatient Medications (Other)  Medication Sig   atorvastatin (LIPITOR) 40 MG tablet Take 40 mg by mouth at bedtime.   B Complex Vitamins (B COMPLEX PO) Take 1 tablet by mouth daily.   hydrochlorothiazide (HYDRODIURIL) 12.5 MG tablet  Take 12.5 mg by mouth daily.   losartan (COZAAR) 25 MG tablet Take 25 mg by mouth daily.   OZEMPIC, 0.25 OR 0.5 MG/DOSE, 2 MG/1.5ML SOPN Inject 0.5 mLs into the skin once a week.   TOUJEO SOLOSTAR 300 UNIT/ML Solostar Pen Inject 35 Units into the skin daily.   cetirizine (ZYRTEC) 10 MG tablet Take 1 tablet daily as needed for allergies.  May take twice daily if necessary.   citalopram (CELEXA) 40 MG tablet Take 40 mg by mouth daily.   Dulaglutide (TRULICITY) 3 GN/0.0BB SOPN Inject 3 mg into the skin once a week.   lisinopril (ZESTRIL) 10 MG tablet Take 10 mg by mouth daily.   pantoprazole (PROTONIX) 40 MG tablet Take 1 tablet (40 mg total) by mouth 2 (two) times daily.   No current facility-administered medications for this visit. (Other)   REVIEW OF SYSTEMS: ROS   Positive for: Endocrine, Eyes Negative for: Constitutional, Gastrointestinal, Neurological, Skin, Genitourinary, Musculoskeletal, HENT, Cardiovascular, Respiratory, Psychiatric, Allergic/Imm, Heme/Lymph Last edited by Theodore Demark, COA on 02/03/2022  2:00 PM.     ALLERGIES Allergies  Allergen Reactions   Pork-Derived Products Hives   Tilactase Diarrhea    Other reaction(s): Cough (ALLERGY/intolerance)   PAST MEDICAL HISTORY Past Medical History:  Diagnosis Date   Diabetes mellitus    Hypertension    Past Surgical History:  Procedure Laterality Date   BIOPSY  08/02/2020   Procedure: BIOPSY;  Surgeon: Otis Brace, MD;  Location: [facility];  Service: Gastroenterology;;   CESAREAN SECTION     ESOPHAGOGASTRODUODENOSCOPY (EGD) WITH PROPOFOL N/A 08/02/2020   Procedure: ESOPHAGOGASTRODUODENOSCOPY (EGD) WITH PROPOFOL;  Surgeon: Otis Brace, MD;  Location: [facility];  Service: Gastroenterology;  Laterality: N/A;   FAMILY HISTORY History reviewed. No pertinent family history.  SOCIAL HISTORY Social History   Tobacco Use   Smoking status: Never   Smokeless tobacco: Never  Vaping Use   Vaping Use:  Never used  Substance Use Topics   Alcohol use: No   Drug use: No       OPHTHALMIC EXAM:  Base Eye Exam     Visual Acuity (Snellen - Linear)       Right Left   Dist cc 20/25 -2 20/100 +2   Dist ph cc 20/25 +1 NI    Correction: Glasses         Tonometry (Tonopen, 1:55 PM)       Right Left   Pressure 15 17         Pupils       Dark Light Shape React APD   Right 4 3 Round Brisk None   Left 4 3 Round Brisk None         Visual Fields (Counting fingers)       Left Right    Full Full         Extraocular Movement       Right Left    Full, Ortho Full, Ortho         Neuro/Psych     Oriented x3: Yes   Mood/Affect: Normal         Dilation     Both eyes: 1.0% Mydriacyl, 2.5% Phenylephrine @ 1:54 PM           Slit Lamp and Fundus Exam     Slit Lamp Exam       Right Left   Lids/Lashes Dermatochalasis - upper lid Dermatochalasis - upper lid   Conjunctiva/Sclera mild melanosis mild melanosis   Cornea trace PEE trace PEE   Anterior Chamber Deep and quiet Deep and quiet   Iris Round and dilated, No NVI Round and dilated, No NVI   Lens 2+ Nuclear sclerosis, 2+ Cortical cataract 2+ Nuclear sclerosis, 2+ Cortical cataract, Vacuoles   Anterior Vitreous Vitreous syneresis Vitreous syneresis         Fundus Exam       Right Left   Disc Mild pallor, Sharp rim Mild pallor, Sharp rim   C/D Ratio 0.6 0.5   Macula Flat, Blunted foveal reflex, scattered MA/DBH, +edema temporal macula -- persistent, focal CWS ST to fovea Blunted foveal reflex, central edema with central cluster of MA/DBH -- persistent   Vessels attenuated, Tortuous attenuated, Tortuous   Periphery Attached, 360 MA/DBH greatest posteriorly, scattered punctate exudates - improved Attached, 360 MA/DBH greatest posteriorly, scattered punctate exudates - improved           Refraction     Wearing Rx       Sphere Cylinder Axis Add   Right -5.75 +0.50 050 +1.75   Left -6.25 +0.50 171  +1.75           IMAGING AND PROCEDURES  Imaging and Procedures for 02/03/2022  OCT, Retina - OU - Both Eyes       Right Eye Quality was good. Central Foveal Thickness: 466. Progression has been stable. Findings include abnormal foveal contour,  intraretinal hyper-reflective material, intraretinal fluid, subretinal fluid, vitreomacular adhesion (Persistent IRF/IRHM greatest temporal macula and fovea -- slightly increased, +VMA).   Left Eye Quality was good. Central Foveal Thickness: 736. Progression has improved. Findings include abnormal foveal contour, intraretinal hyper-reflective material, intraretinal fluid, subretinal fluid, vitreomacular adhesion (Mild Interval improvement in IRF/SRF -- still very severe, VMA).   Notes *Images captured and stored on drive  Diagnosis / Impression:  +DME OU  OD: Persistent IRF/IRHM greatest temporal macula and fovea -- slightly increased, +VMA OS: Mild Interval improvement in IRF/SRF -- still very severe, VMA  Clinical management:  See below  Abbreviations: NFP - Normal foveal profile. CME - cystoid macular edema. PED - pigment epithelial detachment. IRF - intraretinal fluid. SRF - subretinal fluid. EZ - ellipsoid zone. ERM - epiretinal membrane. ORA - outer retinal atrophy. ORT - outer retinal tubulation. SRHM - subretinal hyper-reflective material. IRHM - intraretinal hyper-reflective material      Intravitreal Injection, Pharmacologic Agent - OD - Right Eye       Time Out 02/03/2022. 2:18 PM. Confirmed correct patient, procedure, site, and patient consented.   Anesthesia Topical anesthesia was used. Anesthetic medications included Lidocaine 2%, Proparacaine 0.5%.   Procedure Preparation included 5% betadine to ocular surface, eyelid speculum. A (32g) needle was used.   Injection: 1.25 mg Bevacizumab 1.30m/0.05ml   Route: Intravitreal, Site: Right Eye   NDC: [id number] Lot:: [id number] Expiration date: 04/18/2022   Post-op Post  injection exam found visual acuity of at least counting fingers. The patient tolerated the procedure well. There were no complications. The patient received written and verbal post procedure care education. Post injection medications were not given.      Intravitreal Injection, Pharmacologic Agent - OS - Left Eye       Time Out 02/03/2022. 2:18 PM. Confirmed correct patient, procedure, site, and patient consented.   Anesthesia Topical anesthesia was used. Anesthetic medications included Lidocaine 2%, Proparacaine 0.5%.   Procedure Preparation included 5% betadine to ocular surface, eyelid speculum. A (32g) needle was used.   Injection: 2 mg aflibercept 2 MG/0.05ML   Route: Intravitreal, Site: Left Eye   NDC:: 24401-027-25 Lot: [id number] Expiration date: 11/28/2022, Waste: 0 mL   Post-op Post injection exam found visual acuity of at least counting fingers. The patient tolerated the procedure well. There were no complications. The patient received written and verbal post procedure care education.   Notes **SAMPLE MEDICATION ADMINISTERED**            ASSESSMENT/PLAN:    ICD-10-CM   1. Severe nonproliferative diabetic retinopathy of both eyes with macular edema associated with type 2 diabetes mellitus (HCC)  E11.3413 OCT, Retina - OU - Both Eyes    Intravitreal Injection, Pharmacologic Agent - OD - Right Eye    Intravitreal Injection, Pharmacologic Agent - OS - Left Eye    aflibercept (EYLEA) SOLN 2 mg    Bevacizumab (AVASTIN) SOLN 1.25 mg    2. Essential hypertension  I10     3. Hypertensive retinopathy of both eyes  H35.033     4. Combined forms of age-related cataract of both eyes  H25.813      Severe Non-proliferative diabetic retinopathy, both eyes - last A1c was 7.4 on 6.16.23  - formerly managed by Drs TDelorse Limberand SManuella Ghazi received IVE OU x2 with Dr. SManuella Ghazi last one 04.19.22 -- was lost to retina follow up since - FA (05.12.23) shows OD: No NV, leaking MA  concentrated temporal to fovea, mild perivascular leakage temporally; OS:  No NV, leaking MA concentrated centrally, mild perivascular leakage temporally  s/p IVA OU #1 (03.16.23), #2 (04.14.23), #3 (05.12.23), #4 (06.09.23) - exam shows scattered MA/DBH OU -- improving - BCVA 20/25 OD (improved), 20/100 OS (worse) - OCT shows OD: Persistent IRF/IRHM greatest temporal macula and fovea -- slightly increased, +VMA; OS: Mild Interval improvement in IRF/SRF -- still very severe, VMA - discussed possible IVA resistance and potential switch in medication - recommend switching to IVE OS due to persistent DME and decrease in [place] -- will work on Information systems manager for next appt - recommend IVA OD #5 and IVE OS #1 today (sample), 07.07.23 for severe DME OU - pt wishes to proceed with injections - RBA of procedure discussed, questions answered - IVA informed consent obtained and signed, 03.16.23 (OU) - IVE informed consent obtained and signed, 07.07.23 - see procedure note - f/u in 4 wks -- DFE/OCT/possible injection  2,3. Hypertensive retinopathy OU - discussed importance of tight BP control - monitor   4. Mixed Cataract OU - The symptoms of cataract, surgical options, and treatments and risks were discussed with patient. - discussed diagnosis and progression - monitor for now   Ophthalmic Meds Ordered this visit:  Meds ordered this encounter  Medications   aflibercept (EYLEA) SOLN 2 mg   Bevacizumab (AVASTIN) SOLN 1.25 mg     Return in about 4 weeks (around 03/03/2022) for f/u NPDR OU, DFE, OCT.  There are no Patient Instructions on file for this visit.   Explained the diagnoses, plan, and follow up with the patient and they expressed understanding.  Patient expressed understanding of the importance of proper follow up care.   This document serves as a record of services personally performed by Gardiner Sleeper, MD, PhD. It was created on their behalf by Leonie Douglas, an ophthalmic technician. The creation  of this record is the provider's dictation and/or activities during the visit.    Electronically signed by: Leonie Douglas COA, 02/03/22  3:33 PM  This document serves as a record of services personally performed by Gardiner Sleeper, MD, PhD. It was created on their behalf by San Jetty. Owens Shark, OA an ophthalmic technician. The creation of this record is the provider's dictation and/or activities during the visit.    Electronically signed by: San Jetty. Marguerita Merles 07.07.2023 3:33 PM   Gardiner Sleeper, M.D., Ph.D. Diseases & Surgery of the Retina and Vitreous [facility]  I have reviewed the above documentation for accuracy and completeness, and I agree with the above. Gardiner Sleeper, M.D., Ph.D. 02/03/22 3:37 PM   Abbreviations: M myopia (nearsighted); A astigmatism; H hyperopia (farsighted); P presbyopia; Mrx spectacle prescription;  CTL contact lenses; OD right eye; OS left eye; OU both eyes  XT exotropia; ET esotropia; PEK punctate epithelial keratitis; PEE punctate epithelial erosions; DES dry eye syndrome; MGD meibomian gland dysfunction; ATs artificial tears; PFAT's preservative free artificial tears; [place] nuclear sclerotic cataract; PSC posterior subcapsular cataract; ERM epi-retinal membrane; PVD posterior vitreous detachment; RD retinal detachment; DM diabetes mellitus; DR diabetic retinopathy; NPDR non-proliferative diabetic retinopathy; PDR proliferative diabetic retinopathy; CSME clinically significant macular edema; DME diabetic macular edema; dbh dot blot hemorrhages; CWS cotton wool spot; POAG primary open angle glaucoma; C/D cup-to-disc ratio; HVF humphrey visual field; GVF goldmann visual field; OCT optical coherence tomography; IOP intraocular pressure; BRVO Branch retinal vein occlusion; CRVO central retinal vein occlusion; CRAO central retinal artery occlusion; BRAO branch retinal artery occlusion; RT retinal tear; SB scleral  buckle; PPV pars plana vitrectomy; VH  Vitreous hemorrhage; PRP panretinal laser photocoagulation; IVK intravitreal kenalog; VMT vitreomacular traction; MH Macular hole;  NVD neovascularization of the disc; NVE neovascularization elsewhere; AREDS age related eye disease study; ARMD age related macular degeneration; POAG primary open angle glaucoma; EBMD epithelial/anterior basement membrane dystrophy; ACIOL anterior chamber intraocular lens; IOL intraocular lens; PCIOL posterior chamber intraocular lens; Phaco/IOL phacoemulsification with intraocular lens placement; [place] photorefractive keratectomy; LASIK laser assisted in situ keratomileusis; HTN hypertension; DM diabetes mellitus; COPD chronic obstructive pulmonary disease

## 2022-02-03 ENCOUNTER — Encounter (INDEPENDENT_AMBULATORY_CARE_PROVIDER_SITE_OTHER): Payer: Self-pay | Admitting: Ophthalmology

## 2022-02-03 ENCOUNTER — Ambulatory Visit (INDEPENDENT_AMBULATORY_CARE_PROVIDER_SITE_OTHER): Payer: Medicare HMO | Admitting: Ophthalmology

## 2022-02-03 DIAGNOSIS — H25813 Combined forms of age-related cataract, bilateral: Secondary | ICD-10-CM

## 2022-02-03 DIAGNOSIS — H35033 Hypertensive retinopathy, bilateral: Secondary | ICD-10-CM

## 2022-02-03 DIAGNOSIS — E113413 Type 2 diabetes mellitus with severe nonproliferative diabetic retinopathy with macular edema, bilateral: Secondary | ICD-10-CM

## 2022-02-03 DIAGNOSIS — I10 Essential (primary) hypertension: Secondary | ICD-10-CM

## 2022-02-03 MED ORDER — BEVACIZUMAB CHEMO INJECTION 1.25MG/0.05ML SYRINGE FOR KALEIDOSCOPE
1.2500 mg | INTRAVITREAL | Status: AC | PRN
  Administered 2022-02-03: 1.25 mg via INTRAVITREAL

## 2022-02-03 MED ORDER — AFLIBERCEPT 2MG/0.05ML IZ SOLN FOR KALEIDOSCOPE
2.0000 mg | INTRAVITREAL | Status: AC | PRN
  Administered 2022-02-03: 2 mg via INTRAVITREAL

## 2022-02-17 NOTE — Progress Notes (Signed)
[facility] Note  03/03/2022     CHIEF COMPLAINT Patient presents for Retina Follow Up   HISTORY OF PRESENT ILLNESS: [name] is a 56 y.o. female who presents to the clinic today for:   HPI     Retina Follow Up   Patient presents with  Diabetic Retinopathy.  In both eyes.  This started 4 weeks ago.  I, the attending physician,  performed the HPI with the patient and updated documentation appropriately.        Comments   Patient here for 4 weeks retina follow up for NPDR OU. Patient states vision OD is quite well. OS the same as before. No eye pain.       Last edited by Bernarda Caffey, MD on 03/04/2022  2:14 AM.    Pt states right eye vision is "quite good", states July was not a good month for her BP or blood sugar  Referring physician: Curt Jews, PA-C [id number] [place],  [place] [address]  HISTORICAL INFORMATION:   Selected notes from the MEDICAL RECORD NUMBER Referred by Dr. Katy Fitch for concern of bilateral mac edema LEE:  Ocular Hx- PMH- former pt of Drs Delorse Limber and Manuella Ghazi, s/p IVA OU #2 (04.19.22)    CURRENT MEDICATIONS: Current Outpatient Medications (Ophthalmic Drugs)  Medication Sig   naphazoline-pheniramine (NAPHCON-A) 0.025-0.3 % ophthalmic solution Place 2 drops into both eyes every 4 (four) hours as needed for eye irritation or allergies.   No current facility-administered medications for this visit. (Ophthalmic Drugs)   Current Outpatient Medications (Other)  Medication Sig   atorvastatin (LIPITOR) 40 MG tablet Take 40 mg by mouth at bedtime.   B Complex Vitamins (B COMPLEX PO) Take 1 tablet by mouth daily.   hydrochlorothiazide (HYDRODIURIL) 12.5 MG tablet Take 12.5 mg by mouth daily.   losartan (COZAAR) 25 MG tablet Take 25 mg by mouth daily.   OZEMPIC, 0.25 OR 0.5 MG/DOSE, 2 MG/1.5ML SOPN Inject 0.5 mLs into the skin once a week.   TOUJEO SOLOSTAR 300 UNIT/ML Solostar Pen Inject 35 Units into the  skin daily.   cetirizine (ZYRTEC) 10 MG tablet Take 1 tablet daily as needed for allergies.  May take twice daily if necessary.   citalopram (CELEXA) 40 MG tablet Take 40 mg by mouth daily.   Dulaglutide (TRULICITY) 3 CH/8.8FO SOPN Inject 3 mg into the skin once a week.   lisinopril (ZESTRIL) 10 MG tablet Take 10 mg by mouth daily.   pantoprazole (PROTONIX) 40 MG tablet Take 1 tablet (40 mg total) by mouth 2 (two) times daily.   No current facility-administered medications for this visit. (Other)   REVIEW OF SYSTEMS: ROS   Positive for: Endocrine, Eyes Negative for: Constitutional, Gastrointestinal, Neurological, Skin, Genitourinary, Musculoskeletal, HENT, Cardiovascular, Respiratory, Psychiatric, Allergic/Imm, Heme/Lymph Last edited by Theodore Demark, COA on 03/03/2022  1:41 PM.     ALLERGIES Allergies  Allergen Reactions   Pork-Derived Products Hives   Tilactase Diarrhea    Other reaction(s): Cough (ALLERGY/intolerance)   PAST MEDICAL HISTORY Past Medical History:  Diagnosis Date   Diabetes mellitus    Hypertension    Past Surgical History:  Procedure Laterality Date   BIOPSY  08/02/2020   Procedure: BIOPSY;  Surgeon: Otis Brace, MD;  Location: [facility];  Service: Gastroenterology;;   CESAREAN SECTION     ESOPHAGOGASTRODUODENOSCOPY (EGD) WITH PROPOFOL N/A 08/02/2020   Procedure: ESOPHAGOGASTRODUODENOSCOPY (EGD) WITH PROPOFOL;  Surgeon: Otis Brace, MD;  Location: [facility];  Service: Gastroenterology;  Laterality: N/A;   FAMILY HISTORY History reviewed. No pertinent family history.  SOCIAL HISTORY Social History   Tobacco Use   Smoking status: Never   Smokeless tobacco: Never  Vaping Use   Vaping Use: Never used  Substance Use Topics   Alcohol use: No   Drug use: No       OPHTHALMIC EXAM:  Base Eye Exam     Visual Acuity (Snellen - Linear)       Right Left   Dist cc 20/30 -2 20/150   Dist ph cc 20/25 -1 20/100 -1    Correction:  Glasses         Tonometry (Tonopen, 1:39 PM)       Right Left   Pressure 19 17         Pupils       Dark Light Shape React APD   Right 4 3 Round Brisk None   Left 4 3 Round Brisk None         Visual Fields       Left Right    Full Full         Extraocular Movement       Right Left    Full, Ortho Full, Ortho         Neuro/Psych     Oriented x3: Yes   Mood/Affect: Normal         Dilation     Both eyes: 1.0% Mydriacyl, 2.5% Phenylephrine @ 1:39 PM           Slit Lamp and Fundus Exam     Slit Lamp Exam       Right Left   Lids/Lashes Dermatochalasis - upper lid Dermatochalasis - upper lid   Conjunctiva/Sclera mild melanosis mild melanosis   Cornea trace PEE trace PEE   Anterior Chamber Deep and quiet Deep and quiet   Iris Round and dilated, No NVI Round and dilated, No NVI   Lens 2+ Nuclear sclerosis, 2+ Cortical cataract 2+ Nuclear sclerosis, 2+ Cortical cataract, Vacuoles   Anterior Vitreous Vitreous syneresis Vitreous syneresis         Fundus Exam       Right Left   Disc Mild pallor, Sharp rim Mild pallor, Sharp rim   C/D Ratio 0.6 0.5   Macula Flat, Blunted foveal reflex, scattered MA/DBH, +edema temporal macula -- slightly improved Blunted foveal reflex, central edema with central cluster of MA/DBH -- persistent / slightly improved, focal exudate superior to fovea   Vessels attenuated, Tortuous attenuated, Tortuous   Periphery Attached, 360 MA/DBH greatest posteriorly, scattered punctate exudates - improved Attached, 360 MA/DBH greatest posteriorly           Refraction     Wearing Rx       Sphere Cylinder Axis Add   Right -5.75 +0.50 050 +1.75   Left -6.25 +0.50 171 +1.75           IMAGING AND PROCEDURES  Imaging and Procedures for 03/03/2022  OCT, Retina - OU - Both Eyes       Right Eye Quality was good. Central Foveal Thickness: 412. Progression has improved. Findings include abnormal foveal contour, intraretinal  hyper-reflective material, intraretinal fluid, subretinal fluid, vitreomacular adhesion (Mild interval improvement in IRF/IRHM and foveal contour +VMA).   Left Eye Quality was good. Central Foveal Thickness: 699. Progression has improved. Findings include abnormal foveal contour, intraretinal hyper-reflective material, intraretinal fluid, subretinal fluid, vitreomacular adhesion (Mild Interval  improvement in IRF/SRF -- still very severe, +VMA).   Notes *Images captured and stored on drive  Diagnosis / Impression:  +DME OU  OD: Mild interval improvement in IRF/IRHM and foveal contour +VMA OS: Mild Interval improvement in IRF/SRF -- still very severe, +VMA  Clinical management:  See below  Abbreviations: NFP - Normal foveal profile. CME - cystoid macular edema. PED - pigment epithelial detachment. IRF - intraretinal fluid. SRF - subretinal fluid. EZ - ellipsoid zone. ERM - epiretinal membrane. ORA - outer retinal atrophy. ORT - outer retinal tubulation. SRHM - subretinal hyper-reflective material. IRHM - intraretinal hyper-reflective material      Intravitreal Injection, Pharmacologic Agent - OD - Right Eye       Time Out 03/03/2022. 2:44 PM. Confirmed correct patient, procedure, site, and patient consented.   Anesthesia Topical anesthesia was used. Anesthetic medications included Lidocaine 2%, Proparacaine 0.5%.   Procedure Preparation included 5% betadine to ocular surface, eyelid speculum. A supplied needle was used.   Injection: 1.25 mg Bevacizumab 1.73m/0.05ml   Route: Intravitreal, Site: Right Eye   NDC:: 16109-604-54 Lot: [id number] , Expiration date: 05/02/2022   Post-op Post injection exam found visual acuity of at least counting fingers. The patient tolerated the procedure well. There were no complications. The patient received written and verbal post procedure care education. Post injection medications were not given.      Intravitreal Injection, Pharmacologic Agent -  OS - Left Eye       Time Out 03/03/2022. 2:45 PM. Confirmed correct patient, procedure, site, and patient consented.   Anesthesia Topical anesthesia was used. Anesthetic medications included Lidocaine 2%, Proparacaine 0.5%.   Procedure Preparation included 5% betadine to ocular surface, eyelid speculum. A (32g) needle was used.   Injection: 2 mg aflibercept 2 MG/0.05ML   Route: Intravitreal, Site: Left Eye   NDC: [id number] Lot: [id number] Expiration date: 04/30/2023, Waste: 0 mL   Post-op Post injection exam found visual acuity of at least counting fingers. The patient tolerated the procedure well. There were no complications. The patient received written and verbal post procedure care education.            ASSESSMENT/PLAN:    ICD-10-CM   1. Severe nonproliferative diabetic retinopathy of both eyes with macular edema associated with type 2 diabetes mellitus (HCC)  E11.3413 OCT, Retina - OU - Both Eyes    Intravitreal Injection, Pharmacologic Agent - OD - Right Eye    Intravitreal Injection, Pharmacologic Agent - OS - Left Eye    aflibercept (EYLEA) SOLN 2 mg    Bevacizumab (AVASTIN) SOLN 1.25 mg    2. Essential hypertension  I10     3. Hypertensive retinopathy of both eyes  H35.033     4. Combined forms of age-related cataract of both eyes  H25.813       Severe Non-proliferative diabetic retinopathy, both eyes - last A1c was 7.4 on 6.16.23  - formerly managed by Drs TDelorse Limberand SManuella Ghazi received IVE OU x2 with Dr. SManuella Ghazi last one 04.19.22 -- was lost to retina follow up since - FA (05.12.23) shows OD: No NV, leaking MA concentrated temporal to fovea, mild perivascular leakage temporally; OS: No NV, leaking MA concentrated centrally, mild perivascular leakage temporally - s/p IVA OU #1 (03.16.23), #2 (04.14.23), #3 (05.12.23), #4 (06.09.23) -- IVA resistance OS - s/p IVA OD #5 (07.07.23) - s/p IVE OS #1 (sample 07.07.23) - exam shows scattered MA/DBH OU -- improving -  BCVA 20/25 OD, 20/100 OS (stable OU) - OCT  shows OD: Mild interval improvement in IRF/IRHM and foveal contour +VMA; OS: Mild Interval improvement in IRF/SRF -- still very severe, VMA - good initial response to IVE OS #1 - recommend IVA OD #6 and IVE OS # 2 today 08.04.23 for severe DME OU - pt wishes to proceed with injections - RBA of procedure discussed, questions answered - IVA informed consent obtained and signed, 03.16.23 (OU) - IVE informed consent obtained and signed, 07.07.23 - see procedure note - f/u in 4 wks -- DFE/OCT/possible injection  2,3. Hypertensive retinopathy OU - discussed importance of tight BP control - monitor   4. Mixed Cataract OU - The symptoms of cataract, surgical options, and treatments and risks were discussed with patient. - discussed diagnosis and progression - monitor for now   Ophthalmic Meds Ordered this visit:  Meds ordered this encounter  Medications   aflibercept (EYLEA) SOLN 2 mg   Bevacizumab (AVASTIN) SOLN 1.25 mg     Return in about 4 weeks (around 03/31/2022) for f/u NPDR OU, DFE, OCT.  There are no Patient Instructions on file for this visit.   Explained the diagnoses, plan, and follow up with the patient and they expressed understanding.  Patient expressed understanding of the importance of proper follow up care.   This document serves as a record of services personally performed by Gardiner Sleeper, MD, PhD. It was created on their behalf by Renaldo Reel, [place] an ophthalmic technician. The creation of this record is the provider's dictation and/or activities during the visit.    Electronically signed by:  Renaldo Reel, COT  02/17/22 2:18 AM  This document serves as a record of services personally performed by Gardiner Sleeper, MD, PhD. It was created on their behalf by San Jetty. Owens Shark, OA an ophthalmic technician. The creation of this record is the provider's dictation and/or activities during the visit.    Electronically  signed by: San Jetty. Owens Shark, [place] 08.04.2023 2:18 AM  Gardiner Sleeper, M.D., Ph.D. Diseases & Surgery of the Retina and Vitreous [facility]  I have reviewed the above documentation for accuracy and completeness, and I agree with the above. Gardiner Sleeper, M.D., Ph.D. 03/04/22 2:18 AM  Abbreviations: M myopia (nearsighted); A astigmatism; H hyperopia (farsighted); P presbyopia; Mrx spectacle prescription;  CTL contact lenses; OD right eye; OS left eye; OU both eyes  XT exotropia; ET esotropia; PEK punctate epithelial keratitis; PEE punctate epithelial erosions; DES dry eye syndrome; MGD meibomian gland dysfunction; ATs artificial tears; PFAT's preservative free artificial tears; [place] nuclear sclerotic cataract; PSC posterior subcapsular cataract; ERM epi-retinal membrane; PVD posterior vitreous detachment; RD retinal detachment; DM diabetes mellitus; DR diabetic retinopathy; NPDR non-proliferative diabetic retinopathy; PDR proliferative diabetic retinopathy; CSME clinically significant macular edema; DME diabetic macular edema; dbh dot blot hemorrhages; CWS cotton wool spot; POAG primary open angle glaucoma; C/D cup-to-disc ratio; HVF humphrey visual field; GVF goldmann visual field; OCT optical coherence tomography; IOP intraocular pressure; BRVO Branch retinal vein occlusion; CRVO central retinal vein occlusion; CRAO central retinal artery occlusion; BRAO branch retinal artery occlusion; RT retinal tear; SB scleral buckle; PPV pars plana vitrectomy; VH Vitreous hemorrhage; PRP panretinal laser photocoagulation; IVK intravitreal kenalog; VMT vitreomacular traction; MH Macular hole;  NVD neovascularization of the disc; NVE neovascularization elsewhere; AREDS age related eye disease study; ARMD age related macular degeneration; POAG primary open angle glaucoma; EBMD epithelial/anterior basement membrane dystrophy; ACIOL anterior chamber intraocular lens; IOL intraocular lens; PCIOL  posterior chamber intraocular lens; Phaco/IOL  phacoemulsification with intraocular lens placement; Herreid photorefractive keratectomy; LASIK laser assisted in situ keratomileusis; HTN hypertension; DM diabetes mellitus; COPD chronic obstructive pulmonary disease

## 2022-03-03 ENCOUNTER — Encounter (INDEPENDENT_AMBULATORY_CARE_PROVIDER_SITE_OTHER): Payer: Self-pay | Admitting: Ophthalmology

## 2022-03-03 ENCOUNTER — Ambulatory Visit (INDEPENDENT_AMBULATORY_CARE_PROVIDER_SITE_OTHER): Payer: Medicare HMO | Admitting: Ophthalmology

## 2022-03-03 DIAGNOSIS — H25813 Combined forms of age-related cataract, bilateral: Secondary | ICD-10-CM

## 2022-03-03 DIAGNOSIS — E113413 Type 2 diabetes mellitus with severe nonproliferative diabetic retinopathy with macular edema, bilateral: Secondary | ICD-10-CM

## 2022-03-03 DIAGNOSIS — H35033 Hypertensive retinopathy, bilateral: Secondary | ICD-10-CM | POA: Diagnosis not present

## 2022-03-03 DIAGNOSIS — I10 Essential (primary) hypertension: Secondary | ICD-10-CM | POA: Diagnosis not present

## 2022-03-04 ENCOUNTER — Encounter (INDEPENDENT_AMBULATORY_CARE_PROVIDER_SITE_OTHER): Payer: Self-pay | Admitting: Ophthalmology

## 2022-03-04 MED ORDER — AFLIBERCEPT 2MG/0.05ML IZ SOLN FOR KALEIDOSCOPE
2.0000 mg | INTRAVITREAL | Status: AC | PRN
  Administered 2022-03-04: 2 mg via INTRAVITREAL

## 2022-03-04 MED ORDER — BEVACIZUMAB CHEMO INJECTION 1.25MG/0.05ML SYRINGE FOR KALEIDOSCOPE
1.2500 mg | INTRAVITREAL | Status: AC | PRN
  Administered 2022-03-04: 1.25 mg via INTRAVITREAL

## 2022-03-31 ENCOUNTER — Encounter (INDEPENDENT_AMBULATORY_CARE_PROVIDER_SITE_OTHER): Payer: Medicare HMO | Admitting: Ophthalmology

## 2022-03-31 DIAGNOSIS — H35033 Hypertensive retinopathy, bilateral: Secondary | ICD-10-CM

## 2022-03-31 DIAGNOSIS — I10 Essential (primary) hypertension: Secondary | ICD-10-CM

## 2022-03-31 DIAGNOSIS — E113413 Type 2 diabetes mellitus with severe nonproliferative diabetic retinopathy with macular edema, bilateral: Secondary | ICD-10-CM

## 2022-03-31 DIAGNOSIS — H25813 Combined forms of age-related cataract, bilateral: Secondary | ICD-10-CM

## 2022-04-06 NOTE — Progress Notes (Signed)
[facility] Note  04/10/2022     CHIEF COMPLAINT Patient presents for Retina Follow Up   HISTORY OF PRESENT ILLNESS: [name] is a 56 y.o. female who presents to the clinic today for:   HPI     Retina Follow Up   Patient presents with  Diabetic Retinopathy.  In both eyes.  Duration of 4 weeks.  Since onset it is stable.  I, the attending physician,  performed the HPI with the patient and updated documentation appropriately.        Comments   Patient denies noticing any vision changes at this time. Her blood sugar was 124 and she is unsure of her A1C.       Last edited by Bernarda Caffey, MD on 04/10/2022 12:16 PM.     Pt states   Referring physician: Curt Jews, PA-C [id number] [place],  [place] [address]  HISTORICAL INFORMATION:   Selected notes from the MEDICAL RECORD NUMBER Referred by Dr. Katy Fitch for concern of bilateral mac edema LEE:  Ocular Hx- PMH- former pt of Drs Delorse Limber and Manuella Ghazi, s/p IVA OU #2 (04.19.22)    CURRENT MEDICATIONS: Current Outpatient Medications (Ophthalmic Drugs)  Medication Sig   naphazoline-pheniramine (NAPHCON-A) 0.025-0.3 % ophthalmic solution Place 2 drops into both eyes every 4 (four) hours as needed for eye irritation or allergies.   No current facility-administered medications for this visit. (Ophthalmic Drugs)   Current Outpatient Medications (Other)  Medication Sig   atorvastatin (LIPITOR) 40 MG tablet Take 40 mg by mouth at bedtime.   B Complex Vitamins (B COMPLEX PO) Take 1 tablet by mouth daily.   cetirizine (ZYRTEC) 10 MG tablet Take 1 tablet daily as needed for allergies.  May take twice daily if necessary.   citalopram (CELEXA) 40 MG tablet Take 40 mg by mouth daily.   Dulaglutide (TRULICITY) 3 YD/7.4JO SOPN Inject 3 mg into the skin once a week.   hydrochlorothiazide (HYDRODIURIL) 12.5 MG tablet Take 12.5 mg by mouth daily.   lisinopril (ZESTRIL) 10 MG tablet Take 10  mg by mouth daily.   losartan (COZAAR) 25 MG tablet Take 25 mg by mouth daily.   OZEMPIC, 0.25 OR 0.5 MG/DOSE, 2 MG/1.5ML SOPN Inject 0.5 mLs into the skin once a week.   pantoprazole (PROTONIX) 40 MG tablet Take 1 tablet (40 mg total) by mouth 2 (two) times daily.   TOUJEO SOLOSTAR 300 UNIT/ML Solostar Pen Inject 35 Units into the skin daily.   No current facility-administered medications for this visit. (Other)   REVIEW OF SYSTEMS: ROS   Positive for: Endocrine, Eyes Negative for: Constitutional, Gastrointestinal, Neurological, Skin, Genitourinary, Musculoskeletal, HENT, Cardiovascular, Respiratory, Psychiatric, Allergic/Imm, Heme/Lymph Last edited by Annie Paras, COT on 04/10/2022  8:50 AM.     ALLERGIES Allergies  Allergen Reactions   Pork-Derived Products Hives   Tilactase Diarrhea    Other reaction(s): Cough (ALLERGY/intolerance)   PAST MEDICAL HISTORY Past Medical History:  Diagnosis Date   Diabetes mellitus    Hypertension    Past Surgical History:  Procedure Laterality Date   BIOPSY  08/02/2020   Procedure: BIOPSY;  Surgeon: Otis Brace, MD;  Location: [facility];  Service: Gastroenterology;;   CESAREAN SECTION     ESOPHAGOGASTRODUODENOSCOPY (EGD) WITH PROPOFOL N/A 08/02/2020   Procedure: ESOPHAGOGASTRODUODENOSCOPY (EGD) WITH PROPOFOL;  Surgeon: Otis Brace, MD;  Location: [facility];  Service: Gastroenterology;  Laterality: N/A;   FAMILY HISTORY History reviewed. No pertinent  family history.  SOCIAL HISTORY Social History   Tobacco Use   Smoking status: Never   Smokeless tobacco: Never  Vaping Use   Vaping Use: Never used  Substance Use Topics   Alcohol use: No   Drug use: No       OPHTHALMIC EXAM:  Base Eye Exam     Visual Acuity (Snellen - Linear)       Right Left   Dist cc 20/30 +2 20/150   Dist ph cc NI 20/100    Correction: Glasses         Tonometry (Tonopen, 8:54 AM)       Right Left   Pressure 15 15          Pupils       Dark Light Shape React APD   Right 4 3 Round Brisk None   Left 4 3 Round Brisk None         Visual Fields       Left Right    Full Full         Extraocular Movement       Right Left    Full, Ortho Full, Ortho         Neuro/Psych     Oriented x3: Yes   Mood/Affect: Normal         Dilation     Both eyes: 1.0% Mydriacyl, 2.5% Phenylephrine @ 8:51 AM           Slit Lamp and Fundus Exam     Slit Lamp Exam       Right Left   Lids/Lashes Dermatochalasis - upper lid Dermatochalasis - upper lid   Conjunctiva/Sclera mild melanosis mild melanosis   Cornea trace PEE trace PEE   Anterior Chamber Deep and quiet Deep and quiet   Iris Round and dilated, No NVI Round and dilated, No NVI   Lens 2+ Nuclear sclerosis, 2+ Cortical cataract 2+ Nuclear sclerosis, 2+ Cortical cataract, Vacuoles   Anterior Vitreous Vitreous syneresis Vitreous syneresis         Fundus Exam       Right Left   Disc Mild pallor, Sharp rim Mild pallor, Sharp rim   C/D Ratio 0.6 0.5   Macula Flat, Blunted foveal reflex, scattered MA/DBH, +edema temporal macula -- slightly increased Blunted foveal reflex, central edema with central cluster of MA/DBH -- persistent / slightly improved, focal exudates superior to fovea, scattered MA   Vessels attenuated, Tortuous attenuated, Tortuous   Periphery Attached, 360 MA/DBH greatest posteriorly, scattered punctate exudates - stably improved Attached, 360 MA/DBH greatest posteriorly           Refraction     Wearing Rx       Sphere Cylinder Axis Add   Right -5.75 +0.50 050 +1.75   Left -6.25 +0.50 171 +1.75           IMAGING AND PROCEDURES  Imaging and Procedures for 04/10/2022  OCT, Retina - OU - Both Eyes       Right Eye Quality was good. Central Foveal Thickness: 422. Progression has worsened. Findings include abnormal foveal contour, intraretinal hyper-reflective material, intraretinal fluid, subretinal fluid,  vitreomacular adhesion (Persistent IRF temporal foveal and macula --slightly increased, +VMA).   Left Eye Quality was good. Central Foveal Thickness: 590. Progression has improved. Findings include abnormal foveal contour, intraretinal hyper-reflective material, intraretinal fluid, subretinal fluid, vitreomacular adhesion (Mild Interval improvement in IRF/SRF -- still severe, +VMA).   Notes *Images captured and stored on drive  Diagnosis / Impression:  +DME OU  OD: Persistent IRF temporal foveal and macula --slightly increased, +VMA OS: Mild Interval improvement in IRF/SRF -- still severe, +VMA  Clinical management:  See below  Abbreviations: NFP - Normal foveal profile. CME - cystoid macular edema. PED - pigment epithelial detachment. IRF - intraretinal fluid. SRF - subretinal fluid. EZ - ellipsoid zone. ERM - epiretinal membrane. ORA - outer retinal atrophy. ORT - outer retinal tubulation. SRHM - subretinal hyper-reflective material. IRHM - intraretinal hyper-reflective material      Intravitreal Injection, Pharmacologic Agent - OD - Right Eye       Time Out 04/10/2022. 9:12 AM. Confirmed correct patient, procedure, site, and patient consented.   Anesthesia Topical anesthesia was used. Anesthetic medications included Lidocaine 2%, Proparacaine 0.5%.   Procedure Preparation included 5% betadine to ocular surface, eyelid speculum. A supplied needle was used.   Injection: 2 mg aflibercept 2 MG/0.05ML   Route: Intravitreal, Site: Right Eye   NDC: A3590391, Lot: [id number], Expiration date: 04/30/2023, Waste: 0 mL   Post-op Post injection exam found visual acuity of at least counting fingers. The patient tolerated the procedure well. There were no complications. The patient received written and verbal post procedure care education. Post injection medications were not given.      Intravitreal Injection, Pharmacologic Agent - OS - Left Eye       Time Out 04/10/2022. 9:12  AM. Confirmed correct patient, procedure, site, and patient consented.   Anesthesia Topical anesthesia was used. Anesthetic medications included Lidocaine 2%, Proparacaine 0.5%.   Procedure Preparation included 5% betadine to ocular surface, eyelid speculum. A (32g) needle was used.   Injection: 2 mg aflibercept 2 MG/0.05ML   Route: Intravitreal, Site: Left Eye   NDC: A3590391, Lot: [id number], Expiration date: 05/31/2023, Waste: 0 mL   Post-op Post injection exam found visual acuity of at least counting fingers. The patient tolerated the procedure well. There were no complications. The patient received written and verbal post procedure care education.            ASSESSMENT/PLAN:    ICD-10-CM   1. Severe nonproliferative diabetic retinopathy of both eyes with macular edema associated with type 2 diabetes mellitus (HCC)  E11.3413 OCT, Retina - OU - Both Eyes    Intravitreal Injection, Pharmacologic Agent - OD - Right Eye    Intravitreal Injection, Pharmacologic Agent - OS - Left Eye    aflibercept (EYLEA) SOLN 2 mg    aflibercept (EYLEA) SOLN 2 mg    2. Essential hypertension  I10     3. Hypertensive retinopathy of both eyes  H35.033     4. Combined forms of age-related cataract of both eyes  H25.813      Severe Non-proliferative diabetic retinopathy, both eyes  - delayed to follow up from 4 weeks to 5 weeks - last A1c was 7.4 on 6.16.23  - formerly managed by Drs Delorse Limber and Manuella Ghazi, received IVE OU x2 with Dr. Manuella Ghazi, last one 04.19.22 -- was lost to retina follow up since - FA (05.12.23) shows OD: No NV, leaking MA concentrated temporal to fovea, mild perivascular leakage temporally; OS: No NV, leaking MA concentrated centrally, mild perivascular leakage temporally - s/p IVA OU #1 (03.16.23), #2 (04.14.23), #3 (05.12.23), #4 (06.09.23) -- IVA resistance OS - s/p IVA OD #5 (07.07.23), #6 (08.04.23) - s/p IVE OS #1 (sample 07.07.23), #2 (08.04.23) - exam shows scattered  MA/DBH OU -- improving - BCVA 20/30 OD, 20/100 OS (stable OU) - OCT  shows [id number] IRF temporal foveal and macula -- slightly increased, +VMA; OS: Mild Interval improvement in IRF/SRF -- still severe, +VMA - recommend IVE OD #1 and IVE OS # 3 today 09.11.23 for severe DME OU - pt wishes to proceed with injections - RBA of procedure discussed, questions answered - IVA informed consent obtained and signed, 03.16.23 (OU) - IVE informed consent obtained and signed, 09.11.23 (OU) - see procedure note - f/u in 4 wks -- DFE/OCT/possible injection  2,3. Hypertensive retinopathy OU - discussed importance of tight BP control - monitor  4. Mixed Cataract OU - The symptoms of cataract, surgical options, and treatments and risks were discussed with patient. - discussed diagnosis and progression - monitor for now   Ophthalmic Meds Ordered this visit:  Meds ordered this encounter  Medications   aflibercept (EYLEA) SOLN 2 mg   aflibercept (EYLEA) SOLN 2 mg     Return in about 4 weeks (around 05/08/2022) for f/u NPDR OU, DFE, OCT.  There are no Patient Instructions on file for this visit.   Explained the diagnoses, plan, and follow up with the patient and they expressed understanding.  Patient expressed understanding of the importance of proper follow up care.   This document serves as a record of services personally performed by Gardiner Sleeper, MD, PhD. It was created on their behalf by Roselee Nova, COMT. The creation of this record is the provider's dictation and/or activities during the visit.  Electronically signed by: Roselee Nova, COMT 04/10/22 12:21 PM  This document serves as a record of services personally performed by Gardiner Sleeper, MD, PhD. It was created on their behalf by San Jetty. Owens Shark, OA an ophthalmic technician. The creation of this record is the provider's dictation and/or activities during the visit.    Electronically signed by: San Jetty. Owens Shark, [place] 09.11.2023 12:21  PM   Gardiner Sleeper, M.D., Ph.D. Diseases & Surgery of the Retina and Vitreous [facility]  I have reviewed the above documentation for accuracy and completeness, and I agree with the above. Gardiner Sleeper, M.D., Ph.D. 04/10/22 12:23 PM   Abbreviations: M myopia (nearsighted); A astigmatism; H hyperopia (farsighted); P presbyopia; Mrx spectacle prescription;  CTL contact lenses; OD right eye; OS left eye; OU both eyes  XT exotropia; ET esotropia; PEK punctate epithelial keratitis; PEE punctate epithelial erosions; DES dry eye syndrome; MGD meibomian gland dysfunction; ATs artificial tears; PFAT's preservative free artificial tears; [place] nuclear sclerotic cataract; PSC posterior subcapsular cataract; ERM epi-retinal membrane; PVD posterior vitreous detachment; RD retinal detachment; DM diabetes mellitus; DR diabetic retinopathy; NPDR non-proliferative diabetic retinopathy; PDR proliferative diabetic retinopathy; CSME clinically significant macular edema; DME diabetic macular edema; dbh dot blot hemorrhages; CWS cotton wool spot; POAG primary open angle glaucoma; C/D cup-to-disc ratio; HVF humphrey visual field; GVF goldmann visual field; OCT optical coherence tomography; IOP intraocular pressure; BRVO Branch retinal vein occlusion; CRVO central retinal vein occlusion; CRAO central retinal artery occlusion; BRAO branch retinal artery occlusion; RT retinal tear; SB scleral buckle; PPV pars plana vitrectomy; VH Vitreous hemorrhage; PRP panretinal laser photocoagulation; IVK intravitreal kenalog; VMT vitreomacular traction; MH Macular hole;  NVD neovascularization of the disc; NVE neovascularization elsewhere; AREDS age related eye disease study; ARMD age related macular degeneration; POAG primary open angle glaucoma; EBMD epithelial/anterior basement membrane dystrophy; ACIOL anterior chamber intraocular lens; IOL intraocular lens; PCIOL posterior chamber intraocular lens; Phaco/IOL  phacoemulsification with intraocular lens placement; [place] photorefractive keratectomy; LASIK laser assisted in situ keratomileusis;  HTN hypertension; DM diabetes mellitus; COPD chronic obstructive pulmonary disease

## 2022-04-10 ENCOUNTER — Encounter (INDEPENDENT_AMBULATORY_CARE_PROVIDER_SITE_OTHER): Payer: Self-pay | Admitting: Ophthalmology

## 2022-04-10 ENCOUNTER — Ambulatory Visit (INDEPENDENT_AMBULATORY_CARE_PROVIDER_SITE_OTHER): Payer: Medicare HMO | Admitting: Ophthalmology

## 2022-04-10 DIAGNOSIS — E113413 Type 2 diabetes mellitus with severe nonproliferative diabetic retinopathy with macular edema, bilateral: Secondary | ICD-10-CM | POA: Diagnosis not present

## 2022-04-10 DIAGNOSIS — H35033 Hypertensive retinopathy, bilateral: Secondary | ICD-10-CM

## 2022-04-10 DIAGNOSIS — H25813 Combined forms of age-related cataract, bilateral: Secondary | ICD-10-CM | POA: Diagnosis not present

## 2022-04-10 DIAGNOSIS — I10 Essential (primary) hypertension: Secondary | ICD-10-CM | POA: Diagnosis not present

## 2022-04-10 MED ORDER — AFLIBERCEPT 2MG/0.05ML IZ SOLN FOR KALEIDOSCOPE
2.0000 mg | INTRAVITREAL | Status: AC | PRN
  Administered 2022-04-10: 2 mg via INTRAVITREAL

## 2022-04-19 ENCOUNTER — Ambulatory Visit: Payer: Self-pay | Admitting: Clinical

## 2022-05-04 ENCOUNTER — Ambulatory Visit (INDEPENDENT_AMBULATORY_CARE_PROVIDER_SITE_OTHER): Payer: Medicare HMO | Admitting: Clinical

## 2022-05-04 DIAGNOSIS — F332 Major depressive disorder, recurrent severe without psychotic features: Secondary | ICD-10-CM

## 2022-05-04 NOTE — Progress Notes (Signed)
                Lenny Bouchillon, LCSW 
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
## 2022-05-06 ENCOUNTER — Encounter (INDEPENDENT_AMBULATORY_CARE_PROVIDER_SITE_OTHER): Payer: Self-pay | Admitting: Ophthalmology

## 2022-05-08 ENCOUNTER — Encounter (INDEPENDENT_AMBULATORY_CARE_PROVIDER_SITE_OTHER): Payer: Medicare HMO | Admitting: Ophthalmology

## 2022-05-15 ENCOUNTER — Ambulatory Visit (INDEPENDENT_AMBULATORY_CARE_PROVIDER_SITE_OTHER): Payer: Medicare HMO | Admitting: Clinical

## 2022-05-15 DIAGNOSIS — F332 Major depressive disorder, recurrent severe without psychotic features: Secondary | ICD-10-CM | POA: Diagnosis not present

## 2022-05-15 DIAGNOSIS — F411 Generalized anxiety disorder: Secondary | ICD-10-CM

## 2022-05-15 NOTE — Progress Notes (Signed)
  [facility] Counselor/Therapist Progress Note  Patient ID: [name], MRN: [id number]    Date: 05/15/22  Time Spent: 1:32  pm - 2:31 pm : 59 Minutes  Treatment Type: Individual Therapy.  Reported Symptoms: Patient reported anger, irritability, recent panic attack, increased agitation. Patient reported feeling on edge when anxious, excessive worry, difficulty sleeping, feels "its my fault", and difficulty controlling the worry.   Mental Status Exam: Appearance:  Neat     Behavior: Appropriate  Motor: Normal  Speech/Language:  Clear and Coherent  Affect: Tearful  Mood: angry  Thought process: normal  Thought content:   WNL  Sensory/Perceptual disturbances:   WNL  Orientation: oriented to person, place, and situation  Attention: Good  Concentration: Good  Memory: WNL  Fund of knowledge:  Good  Insight:   Good  Judgment:  Good  Impulse Control: Fair   Risk Assessment: Danger to Self:  No Patient denied current suicidal ideation Self-injurious Behavior: No Danger to Others: No Patient denied current homicidal ideation Duty to Warn:no Physical Aggression / Violence:No  Access to Firearms a concern: No  Gang Involvement:No   Subjective:  Patient stated, "after we had our conversation it was in a positive direction and felt like a cinder block had been lifted".  Patient reported experiencing a panic attack yesterday, increased agitation, and stated, "I felt I couldn't do anything right". Patient reported within the past two weeks she gets surges of energy that "blow me through the roof" and then experiences a significant decline in energy before the end of the day. Patient reported she feels her primary care provider now relates patient's medical concerns and pain to being "in my head". Patient reported she has an appointment with a specialist tomorrow and reported fear that she will not be able to control her frustration. 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 Patient reported frustration with recent substitute teaching assignment and their expectations. Patient reported she feels her employer perceives her as the "mean person" and not a team player when she sets expectations prior to the substitute teaching assignment. Patient stated, "I feel less than".  Patient stated, "its true" in response to diagnosis. Patient reported feeling on edge when anxious, excessive worry, difficulty sleeping, feels "its my fault", and difficulty controlling the worry. Patient reported experiencing symptoms of anxiety for over 10 years. Patient reported she is open to a referral to a psychiatrist and open to participation in therapy. Patient reported a preference for a referral to a female psychiatrist.   Interventions: Cognitive Behavioral Therapy and Psycho Education. Clinician conducted session via Webex video from clinician's home office. Patient provided verbal consent to proceed with telehealth session and participated in session from patient's home. Discussed patient's feelings of frustration as it related to recent medical appointments and the impact on her mood. Discussed coping strategies for patient to utilize if she begins to feel frustrated during tomorrow's appointment with the specialist, such as, deep breathing exercises and discussing her feelings regarding previous frustrations with the specialist. Clinician reviewed diagnoses and treatment recommendations. Provided psycho education related to diagnoses and treatment.   Diagnosis:  Severe episode of recurrent major depressive disorder, without psychotic features ([place])  Generalized anxiety disorder   Plan: Goals to be developed during follow up appointment on 06/01/22.            Katherina Right, LCSW

## 2022-05-25 NOTE — Progress Notes (Shared)
[facility] Note  05/29/2022     CHIEF COMPLAINT Patient presents for No chief complaint on file.   HISTORY OF PRESENT ILLNESS: [name] is a 56 y.o. female who presents to the clinic today for:     Pt states   Referring physician: Curt Jews, PA-C [id number] [place],  [place] [address]  HISTORICAL INFORMATION:   Selected notes from the MEDICAL RECORD NUMBER Referred by Dr. Katy Fitch for concern of bilateral mac edema LEE:  Ocular Hx- PMH- former pt of Drs Delorse Limber and Manuella Ghazi, s/p IVA OU #2 (04.19.22)    CURRENT MEDICATIONS: Current Outpatient Medications (Ophthalmic Drugs)  Medication Sig   naphazoline-pheniramine (NAPHCON-A) 0.025-0.3 % ophthalmic solution Place 2 drops into both eyes every 4 (four) hours as needed for eye irritation or allergies.   No current facility-administered medications for this visit. (Ophthalmic Drugs)   Current Outpatient Medications (Other)  Medication Sig   atorvastatin (LIPITOR) 40 MG tablet Take 40 mg by mouth at bedtime.   B Complex Vitamins (B COMPLEX PO) Take 1 tablet by mouth daily.   cetirizine (ZYRTEC) 10 MG tablet Take 1 tablet daily as needed for allergies.  May take twice daily if necessary.   citalopram (CELEXA) 40 MG tablet Take 40 mg by mouth daily.   Dulaglutide (TRULICITY) 3 UX/3.2TF SOPN Inject 3 mg into the skin once a week.   hydrochlorothiazide (HYDRODIURIL) 12.5 MG tablet Take 12.5 mg by mouth daily.   lisinopril (ZESTRIL) 10 MG tablet Take 10 mg by mouth daily.   losartan (COZAAR) 25 MG tablet Take 25 mg by mouth daily.   OZEMPIC, 0.25 OR 0.5 MG/DOSE, 2 MG/1.5ML SOPN Inject 0.5 mLs into the skin once a week.   pantoprazole (PROTONIX) 40 MG tablet Take 1 tablet (40 mg total) by mouth 2 (two) times daily.   TOUJEO SOLOSTAR 300 UNIT/ML Solostar Pen Inject 35 Units into the skin daily.   No current facility-administered medications for this visit. (Other)   REVIEW OF  SYSTEMS:   ALLERGIES Allergies  Allergen Reactions   Pork-Derived Products Hives   Tilactase Diarrhea    Other reaction(s): Cough (ALLERGY/intolerance)   PAST MEDICAL HISTORY Past Medical History:  Diagnosis Date   Diabetes mellitus    Hypertension    Past Surgical History:  Procedure Laterality Date   BIOPSY  08/02/2020   Procedure: BIOPSY;  Surgeon: Otis Brace, MD;  Location: [facility];  Service: Gastroenterology;;   CESAREAN SECTION     ESOPHAGOGASTRODUODENOSCOPY (EGD) WITH PROPOFOL N/A 08/02/2020   Procedure: ESOPHAGOGASTRODUODENOSCOPY (EGD) WITH PROPOFOL;  Surgeon: Otis Brace, MD;  Location: [facility];  Service: Gastroenterology;  Laterality: N/A;   FAMILY HISTORY No family history on file.  SOCIAL HISTORY Social History   Tobacco Use   Smoking status: Never   Smokeless tobacco: Never  Vaping Use   Vaping Use: Never used  Substance Use Topics   Alcohol use: No   Drug use: No       OPHTHALMIC EXAM:  Not recorded    IMAGING AND PROCEDURES  Imaging and Procedures for 05/29/2022          ASSESSMENT/PLAN:    ICD-10-CM   1. Severe nonproliferative diabetic retinopathy of both eyes with macular edema associated with type 2 diabetes mellitus ([place])  T73.2202     2. Essential hypertension  I10     3. Hypertensive retinopathy of both eyes  H35.033     4. Combined  forms of age-related cataract of both eyes  H25.813      Severe Non-proliferative diabetic retinopathy, both eyes  - delayed to follow up from 4 weeks to 5 weeks - last A1c was 7.4 on 6.16.23  - formerly managed by Drs Delorse Limber and Manuella Ghazi, received IVE OU x2 with Dr. Manuella Ghazi, last one 04.19.22 -- was lost to retina follow up since - FA (05.12.23) shows OD: No NV, leaking MA concentrated temporal to fovea, mild perivascular leakage temporally; OS: No NV, leaking MA concentrated centrally, mild perivascular leakage temporally - s/p IVA OU #1 (03.16.23), #2 (04.14.23), #3 (05.12.23),  #4 (06.09.23) -- IVA resistance OS - s/p IVA OD #5 (07.07.23), #6 (08.04.23) - s/p IVE OD #1 (09.11.23) - s/p IVE OS #1 (sample 07.07.23), #2 (08.04.23), #3 (09.11.23) - exam shows scattered MA/DBH OU -- improving - BCVA 20/30 OD, 20/100 OS (stable OU) - OCT shows YN:[id number] IRF temporal foveal and macula -- slightly increased, +VMA; OS: Mild Interval improvement in IRF/SRF -- still severe, +VMA - recommend IVE OD #2 and IVE OS #5 today 10.30.23 for severe DME OU - pt wishes to proceed with injections - RBA of procedure discussed, questions answered - IVA informed consent obtained and signed, 03.16.23 (OU) - IVE informed consent obtained and signed, 09.11.23 (OU) - see procedure note - f/u in 4 wks -- DFE/OCT/possible injection  2,3. Hypertensive retinopathy OU - discussed importance of tight BP control - monitor  4. Mixed Cataract OU - The symptoms of cataract, surgical options, and treatments and risks were discussed with patient. - discussed diagnosis and progression - monitor for now   Ophthalmic Meds Ordered this visit:  No orders of the defined types were placed in this encounter.    No follow-ups on file.  There are no Patient Instructions on file for this visit.   Explained the diagnoses, plan, and follow up with the patient and they expressed understanding.  Patient expressed understanding of the importance of proper follow up care.   This document serves as a record of services personally performed by Gardiner Sleeper, MD, PhD. It was created on their behalf by San Jetty. Owens Shark, OA an ophthalmic technician. The creation of this record is the provider's dictation and/or activities during the visit.    Electronically signed by: San Jetty. Owens Shark, [place] 10.26.2023 12:49 PM    Gardiner Sleeper, M.D., Ph.D. Diseases & Surgery of the Retina and Vitreous [facility]: M myopia (nearsighted); A astigmatism; H hyperopia  (farsighted); P presbyopia; Mrx spectacle prescription;  CTL contact lenses; OD right eye; OS left eye; OU both eyes  XT exotropia; ET esotropia; PEK punctate epithelial keratitis; PEE punctate epithelial erosions; DES dry eye syndrome; MGD meibomian gland dysfunction; ATs artificial tears; PFAT's preservative free artificial tears; [place] nuclear sclerotic cataract; PSC posterior subcapsular cataract; ERM epi-retinal membrane; PVD posterior vitreous detachment; RD retinal detachment; DM diabetes mellitus; DR diabetic retinopathy; NPDR non-proliferative diabetic retinopathy; PDR proliferative diabetic retinopathy; CSME clinically significant macular edema; DME diabetic macular edema; dbh dot blot hemorrhages; CWS cotton wool spot; POAG primary open angle glaucoma; C/D cup-to-disc ratio; HVF humphrey visual field; GVF goldmann visual field; OCT optical coherence tomography; IOP intraocular pressure; BRVO Branch retinal vein occlusion; CRVO central retinal vein occlusion; CRAO central retinal artery occlusion; BRAO branch retinal artery occlusion; RT retinal tear; SB scleral buckle; PPV pars plana vitrectomy; VH Vitreous hemorrhage; PRP panretinal laser photocoagulation; IVK intravitreal kenalog; VMT vitreomacular traction; MH  Macular hole;  NVD neovascularization of the disc; NVE neovascularization elsewhere; AREDS age related eye disease study; ARMD age related macular degeneration; POAG primary open angle glaucoma; EBMD epithelial/anterior basement membrane dystrophy; ACIOL anterior chamber intraocular lens; IOL intraocular lens; PCIOL posterior chamber intraocular lens; Phaco/IOL phacoemulsification with intraocular lens placement; [place] photorefractive keratectomy; LASIK laser assisted in situ keratomileusis; HTN hypertension; DM diabetes mellitus; COPD chronic obstructive pulmonary disease

## 2022-05-29 ENCOUNTER — Encounter (INDEPENDENT_AMBULATORY_CARE_PROVIDER_SITE_OTHER): Payer: Medicare HMO | Admitting: Ophthalmology

## 2022-05-29 DIAGNOSIS — E113413 Type 2 diabetes mellitus with severe nonproliferative diabetic retinopathy with macular edema, bilateral: Secondary | ICD-10-CM

## 2022-05-29 DIAGNOSIS — H25813 Combined forms of age-related cataract, bilateral: Secondary | ICD-10-CM

## 2022-05-29 DIAGNOSIS — H35033 Hypertensive retinopathy, bilateral: Secondary | ICD-10-CM

## 2022-05-29 DIAGNOSIS — I10 Essential (primary) hypertension: Secondary | ICD-10-CM

## 2022-06-01 ENCOUNTER — Ambulatory Visit: Payer: Medicare HMO | Admitting: Clinical

## 2022-06-08 ENCOUNTER — Ambulatory Visit (INDEPENDENT_AMBULATORY_CARE_PROVIDER_SITE_OTHER): Payer: Medicare HMO | Admitting: Clinical

## 2022-06-08 DIAGNOSIS — F332 Major depressive disorder, recurrent severe without psychotic features: Secondary | ICD-10-CM

## 2022-06-08 DIAGNOSIS — F411 Generalized anxiety disorder: Secondary | ICD-10-CM | POA: Diagnosis not present

## 2022-06-08 NOTE — Progress Notes (Addendum)
[facility] Counselor/Therapist Progress Note  Patient ID: [name], MRN: [id number]    Date: 06/08/22  Time Spent: 12:30  pm - 1:35 pm : 65 Minutes  Treatment Type: Individual Therapy.  Reported Symptoms: Patient reported feelings of anger and is easily agitated.   Mental Status Exam: Appearance:  Well Groomed     Behavior: Appropriate  Motor: Normal  Speech/Language:  Clear and Coherent  Affect: Flat  Mood: depressed  Thought process: normal  Thought content:   WNL  Sensory/Perceptual disturbances:   WNL  Orientation: oriented to person, place, and situation  Attention: Good  Concentration: Good  Memory: WNL  Fund of knowledge:  Good  Insight:   Good  Judgment:  Good  Impulse Control: Fair   Risk Assessment: Danger to Self:  No Patient denied current suicidal ideation Self-injurious Behavior: No Danger to Others: No Patient denied current homicidal ideation Duty to Warn:no Physical Aggression / Violence:No  Access to Firearms a concern: No  Gang Involvement:No   Subjective:  Patient reported she received paperwork from psychiatry referral and cancelled the appointment due to questions about insurance coverage. Patient reported plans to have a conversation with her insurance provider today. Patient reported she has been more agitated since last session. Patient reported stressors and recent conflict related to her son since last session were triggers for changes in mood. Patient reported when her son went out of town recently "it was a week of bliss". Patient reported when they became homeless in 2017 she "had to shut down". Patient reported her previous relationship was verbally abusive and the individual was manipulative. Patient stated she is "tired of feeling bad". Patient reported her dog, Victory Dakin, is a protective factor and stated, "she is the only soul that does" in regards to bringing patient joy. Patient stated, "I'm not worth anything". Patient  stated, "I want to experience a piece of mind and the calmness" as it relates to long term goals. Patient agreed to treatment plan.   Interventions: Motivational Interviewing. Clinician conducted session via WebEx video from clinician's home office. Patient provided verbal consent to proceed with telehealth session and participated in session from patient's home. Clinician utilized motivational interviewing to explore potential goals for therapy. Clinician utilized a task centered approach in collaboration with patient to develop goals for therapy. Clinician requested patient complete thought record for homework.  Diagnosis:  Severe episode of recurrent major depressive disorder, without psychotic features ([facility])  Generalized anxiety disorder   Plan: Patient is to utilize [employer] Therapy, thought re-framing, relaxation techniques, progressive muscle relaxation, dialectical behavioral therapy skills, and coping strategies to decrease symptoms associated with Major Depressive Disorder and Generalized Anxiety Disorder. Frequency: weekly  Modality: individual     Long-term goal:   Patient stated, "I want to experience a piece of mind and the calmness".    Reduce overall level, frequency, and intensity of the feelings of depression and anxiety as evidenced by decreased anger, agitation, depressed mood, frustration, feelings of worthlessness, difficulty staying asleep, fatigue, decreased concentration, and loss of interest from 7 days per week to 3 to 4 days per week per patient's report for at least 6 consecutive months.  Target Date: 06/09/23  Progress: progressing   Short-term goal:  Identify and verbally express contributing factors to patient's internal happiness per patient's report  Target Date: 12/07/22  Progress: progressing    Decrease feelings of anger, depression and anxiety by developing healthy coping mechanisms  Target Date: 12/07/22  Progress: progressing  Identify, challenge, and replace negative core beliefs, thought patterns, and negative self talk that contribute to feelings of depression, anxiety, and low self esteem with positive thoughts and beliefs per patient's report  Target Date: 12/07/22  Progress: progressing                       Doree Barthel, LCSW

## 2022-06-26 ENCOUNTER — Ambulatory Visit (INDEPENDENT_AMBULATORY_CARE_PROVIDER_SITE_OTHER): Payer: Medicare HMO | Admitting: Clinical

## 2022-06-26 DIAGNOSIS — F411 Generalized anxiety disorder: Secondary | ICD-10-CM

## 2022-06-26 DIAGNOSIS — F332 Major depressive disorder, recurrent severe without psychotic features: Secondary | ICD-10-CM

## 2022-06-26 NOTE — Progress Notes (Signed)
[facility] Counselor/Therapist Progress Note  Patient ID: [name], MRN: [id number],    Date: 06/26/2022  Time Spent: 2:32pm - 3:24pm : 52 minutes   Treatment Type: Individual Therapy  Reported Symptoms: Patient reported depressed mood, feelings of anger and anxiety, and recent suicidal ideation.  Mental Status Exam: Appearance:  Well Groomed     Behavior: Appropriate  Motor: Normal  Speech/Language:  Clear and Coherent  Affect: Flat  Mood: depressed  Thought process: normal  Thought content:   WNL  Sensory/Perceptual disturbances:   WNL  Orientation: oriented to person, place, and situation  Attention: Good  Concentration: Good  Memory: WNL  Fund of knowledge:  Good  Insight:   Good  Judgment:  Good  Impulse Control: Fair   Risk Assessment: Danger to Self:  No Patient denied current suicidal ideation. Patient stated, "I'm just ready if the Lord's ready to take me" but denied suicidal intent. Patient reported previous thoughts of taking all of her insulin when she received her medication this past weekend but denied intent to follow through with thoughts.  Self-injurious Behavior: No Danger to Others: No Patient denied current homicidal ideation Duty to Warn:no Physical Aggression / Violence:No  Access to Firearms a concern: No  Gang Involvement:No   Subjective: Patient reported she completed her thought record for homework. Patient reported she has always kept a journal and reported when reviewing previous journals she identified entries similar to the entries in her thought record. Patient reported fear of financial insecurity has been consistent in both her previous journals and in her current thought record.  Patient reported feelings of anger were reported in previous journals but is more intense now. Patient identified a recent conflict with her son as a trigger for anger. Patient reported she called the mother of her son's partner on thanksgiving  at her son's request and felt the conversation was negative. Patient stated, "she was so toxic" and reported she discontinued the call and blocked her number in response. Patient reported she doesn't want to have a relationship with the mother of her son's partner as she feels it would be a toxic relationship. Patient reported she worries about someone harming her son and his partner. Patient reported a situation in 2018 with a previous student is a trigger for anxiety and patient reported this was a traumatic experience. 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 Patient identified experiencing the thought, "I think I should be doing more" as a trigger for changes in mood. Patient identified her health and decreased mobility as current stressors and triggers for depression.  Interventions: Cognitive Behavioral Therapy. Clinician conducted session via WebEx video from clinician's home office. Patient provided verbal consent to proceed with telehealth session and participated in session from patient's car. Reviewed patient's thought record for homework and identified patterns in thoughts/feelings in current thought record and previous journals. Explored and identified additional triggers for changes in mood and assessed for safety. Provided psycho education related to automatic negative thoughts, thought distortions, the use of Cognitive Behavioral Therapy, and symptoms of depression.    Diagnosis:  Major Depressive Disorder, recurrent, severe without psychotic features  Generalized Anxiety Disorder   Plan: Patient is to utilize Cognitive Behavioral Therapy, thought re-framing, relaxation techniques, progressive muscle relaxation, dialectical behavioral therapy skills, and coping strategies to decrease symptoms associated with Major Depressive Disorder and Generalized Anxiety Disorder. Frequency: weekly  Modality: individual  Long-term goal:   Patient stated, "I want to experience a piece of mind and the calmness".     Reduce overall level, frequency, and intensity of the feelings of depression and anxiety as evidenced by decreased anger, agitation, depressed mood, frustration, feelings of worthlessness, difficulty staying asleep, fatigue, decreased concentration, and loss of interest from 7 days per week to 3 to 4 days per week per patient's report for at least 6 consecutive months.   Target Date: 06/09/23  Progress: progressing    Short-term goal:  Identify and verbally express contributing factors to patient's internal happiness per patient's report   Target Date: 12/07/22  Progress: progressing      Decrease feelings of anger, depression and anxiety by developing healthy coping mechanisms   Target Date: 12/07/22  Progress: progressing    Identify, challenge, and replace negative core beliefs, thought patterns, and negative self talk that contribute to feelings of depression, anxiety, and low self esteem with positive thoughts and beliefs per patient's report   Target Date: 12/07/22  Progress: progressing             Doree Barthel, LCSW

## 2022-06-26 NOTE — Progress Notes (Signed)
                Sherika Kubicki, LCSW 

## 2022-07-03 ENCOUNTER — Ambulatory Visit (INDEPENDENT_AMBULATORY_CARE_PROVIDER_SITE_OTHER): Payer: Medicare HMO | Admitting: Clinical

## 2022-07-03 DIAGNOSIS — F332 Major depressive disorder, recurrent severe without psychotic features: Secondary | ICD-10-CM

## 2022-07-03 DIAGNOSIS — F411 Generalized anxiety disorder: Secondary | ICD-10-CM | POA: Diagnosis not present

## 2022-07-03 NOTE — Progress Notes (Signed)
[facility] Counselor/Therapist Progress Note  Patient ID: [name], MRN: [id number],    Date: 07/03/2022  Time Spent: 1:32p - 2:31pm : 59 minutes   Treatment Type: Individual Therapy  Reported Symptoms: Patient reported feeling "indifferent today".   Mental Status Exam: Appearance:  Well Groomed     Behavior: Appropriate  Motor: Normal  Speech/Language:  Clear and Coherent  Affect: Flat  Mood: Patient reported feeling "indifferent today"  Thought process: normal  Thought content:   WNL  Sensory/Perceptual disturbances:   WNL  Orientation: oriented to person, place, and situation  Attention: Good  Concentration: Good  Memory: WNL  Fund of knowledge:  Good  Insight:   Good  Judgment:  Good  Impulse Control: Fair   Risk Assessment: Danger to Self:  No Patient denied current suicidal ideation Self-injurious Behavior: No Danger to Others: No Patient denied current homicidal ideation Duty to Warn:no Physical Aggression / Violence:No  Access to Firearms a concern: No  Gang Involvement:No   Subjective: Patient reported recent conflict with son regarding the limited time they spend together. Patient stated, "I have resolved to the fact that it's Victory Dakin and I". Patient reported feeling her son is "making up time" and stated, "its like he doesn't have any use for me anymore". Patient stated, "I made a mistake" in regards to utilizing guilt in her approach to the conservation with her son. Patient reported she has expressed to her son that she misses spending time with him, but reported her son says she is being controlling.  Patient reported she told son he was now responsible for cleaning his bathroom. Patient reported she feels her son lost respect for patient when they were displaced. Patient reported she feels her son needs to participate in therapy. Patient reported recent reflection on the conversation during the previous session regarding suicidal ideation  and patient's previous thoughts of taking all of her insulin. Patient reported her doctor recently advised patient diagnosis of diabetes is under control and discontinued patient's insulin. Patient stated, "as long as Riley's with me I'm ok". Patient reported frequent repetition in her thought record. Patient reported she made the decision "to take care of me" in response.   Interventions: Cognitive Behavioral Therapy. Clinician conducted session via WebEx video from clinician's home office. Patient provided verbal consent to proceed with telehealth session and participated in session from patient's home. Provided supportive therapy, active listening, and validation as patient discussed recent conflict between patient and her son and patient's response to the situation. Provided psycho education related to establishing boundaries with patient's son and resources for patient's son to access treatment should he decided to purse treatment. Processed patient's recent thoughts regarding conversation during previous session about suicidal ideation. Reviewed patient's homework. Clinician requested patient continue thought record for homework.    Diagnosis:  Major Depressive Disorder, recurrent, severe without psychotic features   Generalized Anxiety Disorder   Plan: Patient is to utilize Cognitive Behavioral Therapy, thought re-framing, relaxation techniques, progressive muscle relaxation, dialectical behavioral therapy skills, and coping strategies to decrease symptoms associated with Major Depressive Disorder and Generalized Anxiety Disorder. Frequency: weekly  Modality: individual      Long-term goal:   Patient stated, "I want to experience a piece of mind and the calmness".     Reduce overall level, frequency, and intensity of the feelings of depression and anxiety as evidenced by decreased anger, agitation, depressed mood, frustration, feelings of worthlessness, difficulty staying asleep, fatigue,  decreased concentration, and loss of interest  from 7 days per week to 3 to 4 days per week per patient's report for at least 6 consecutive months.   Target Date: 06/09/23  Progress: progressing    Short-term goal:  Identify and verbally express contributing factors to patient's internal happiness per patient's report   Target Date: 12/07/22  Progress: progressing      Decrease feelings of anger, depression and anxiety by developing healthy coping mechanisms   Target Date: 12/07/22  Progress: progressing    Identify, challenge, and replace negative core beliefs, thought patterns, and negative self talk that contribute to feelings of depression, anxiety, and low self esteem with positive thoughts and beliefs per patient's report   Target Date: 12/07/22  Progress: progressing       Doree Barthel, LCSW

## 2022-07-03 NOTE — Progress Notes (Signed)
                Veleda Mun, LCSW 

## 2022-07-10 ENCOUNTER — Ambulatory Visit: Payer: Medicare HMO | Admitting: Clinical

## 2022-07-18 ENCOUNTER — Ambulatory Visit (INDEPENDENT_AMBULATORY_CARE_PROVIDER_SITE_OTHER): Payer: Medicare HMO | Admitting: Clinical

## 2022-07-18 DIAGNOSIS — F332 Major depressive disorder, recurrent severe without psychotic features: Secondary | ICD-10-CM

## 2022-07-18 DIAGNOSIS — F411 Generalized anxiety disorder: Secondary | ICD-10-CM

## 2022-07-18 NOTE — Progress Notes (Signed)
[facility] Counselor/Therapist Progress Note  Patient ID: [name], MRN: [id number],    Date: 07/18/2022  Time Spent: 2:31pm - 3:03pm : 32 minutes   Treatment Type: Individual Therapy  Reported Symptoms: Patient stated, "I'm not in the mood today".  Mental Status Exam: Appearance:  Well Groomed     Behavior: Agitated  Motor: Normal  Speech/Language:  Clear and Coherent  Affect: Flat  Mood: irritable  Thought process: normal  Thought content:   WNL  Sensory/Perceptual disturbances:   WNL  Orientation: oriented to person, place, and situation  Attention: Good  Concentration: Good  Memory: WNL  Fund of knowledge:  Good  Insight:   Poor  Judgment:  Fair  Impulse Control: Poor   Risk Assessment: Danger to Self:  No Patient denied current suicidal ideation Self-injurious Behavior: No Danger to Others: No Patient denied current homicidal ideation Duty to Warn:no Physical Aggression / Violence:No  Access to Firearms a concern: No  Gang Involvement:No   Subjective: Patient stated, "I'm not in the mood today". Patient reported experiencing a recent anxiety attack. Patient reported she found out on Sunday night that an acquaintance of hers had committed suicide.  Patient reported feeling annoyed and stated, "I don't want to be bothered right now". Patient reported she feels her acquaintance was tired and patient reported she understands feeling tired. Patient reported she noticed a change in acquaintance's pictures. Patient reported it makes her angry when clinician assesses for safety/homicidal ideation and stated,  "I want to worry about me". Patient reported plans to take her dog for a walk today and reported watching her dog run benefits her mood. Patient identified listening to a specific musician as a coping strategy.   Interventions: Cognitive Behavioral Therapy. Clinician conducted session via WebEx video from clinician's office at [facility].  Patient provided verbal consent to proceed with telehealth session and participated in session from patient's home. Provided supportive therapy, active listening, and validation as patient discussed the recent loss of an acquaintance and patient's response to the loss. Provided psycho education related to grief. Explored and identified coping strategies to utilize in response to recent loss. Provided psycho education related to the clinical importance of assessing for safety during sessions.     Diagnosis:  Major Depressive Disorder, recurrent, severe without psychotic features   Generalized Anxiety Disorder   Plan: Patient is to utilize Cognitive Behavioral Therapy, thought re-framing, relaxation techniques, progressive muscle relaxation, dialectical behavioral therapy skills, and coping strategies to decrease symptoms associated with Major Depressive Disorder and Generalized Anxiety Disorder. Frequency: weekly  Modality: individual      Long-term goal:   Patient stated, "I want to experience a piece of mind and the calmness".     Reduce overall level, frequency, and intensity of the feelings of depression and anxiety as evidenced by decreased anger, agitation, depressed mood, frustration, feelings of worthlessness, difficulty staying asleep, fatigue, decreased concentration, and loss of interest from 7 days per week to 3 to 4 days per week per patient's report for at least 6 consecutive months.   Target Date: 06/09/23  Progress: progressing    Short-term goal:  Identify and verbally express contributing factors to patient's internal happiness per patient's report   Target Date: 12/07/22  Progress: progressing      Decrease feelings of anger, depression and anxiety by developing healthy coping mechanisms   Target Date: 12/07/22  Progress: progressing    Identify, challenge, and replace negative core beliefs, thought patterns, and negative  self talk that contribute to feelings of  depression, anxiety, and low self esteem with positive thoughts and beliefs per patient's report   Target Date: 12/07/22  Progress: progressing     Doree Barthel, LCSW

## 2022-07-18 NOTE — Progress Notes (Signed)
                Neveyah Garzon, LCSW 

## 2022-08-18 ENCOUNTER — Ambulatory Visit (INDEPENDENT_AMBULATORY_CARE_PROVIDER_SITE_OTHER): Payer: Medicare HMO | Admitting: Clinical

## 2022-08-18 DIAGNOSIS — F411 Generalized anxiety disorder: Secondary | ICD-10-CM | POA: Diagnosis not present

## 2022-08-18 DIAGNOSIS — F332 Major depressive disorder, recurrent severe without psychotic features: Secondary | ICD-10-CM

## 2022-08-18 NOTE — Progress Notes (Signed)
[facility] Counselor/Therapist Progress Note  Patient ID: BILLI BRIGHT, MRN: [id number],    Date: 08/18/2022  Time Spent: 2:30pm - 3:20pm : 50 minutes   Treatment Type: Individual Therapy  Reported Symptoms: Patient reported physical pain associated with feelings of anxiety.   Mental Status Exam: Appearance:  Well Groomed     Behavior: Appropriate  Motor: Normal  Speech/Language:  Clear and Coherent  Affect: Appropriate  Mood: depressed  Thought process: normal  Thought content:   WNL  Sensory/Perceptual disturbances:   WNL  Orientation: oriented to person, place, and situation  Attention: Good  Concentration: Good  Memory: WNL  Fund of knowledge:  Good  Insight:   Good  Judgment:  Good  Impulse Control: Good   Risk Assessment: Danger to Self:  No Patient denied current suicidal ideation Self-injurious Behavior: No Danger to Others: No Patient denied current homicidal ideation Duty to Warn:no Physical Aggression / Violence:No  Access to Firearms a concern: No  Gang Involvement:No   Subjective: Patient reported she recently changed primary care providers and her new provider prescribed fluoxetine. Patient reported she has taken the medication for 2 weeks now. Patient stated, "I can tell the prozac is kicking in" due to changes in her response to her son and other stressors.  Patient reported improvement in sleep with new medications. Patient reported her son is moving to [place] in 2 weeks. Patient reported financial stressors due to son moving and son not maintaining his financial obligations. Patient stated,  "Im ready for him to go", "I need my peace I need my space". Patient reported awareness that she will have to adjust to living alone. Patient reported feeling the distance may have a positive impact on their relationship. Patient stated, "I don't appreciate it" in regards to son not maintaining his financial responsibilities.  Patient reported her  primary care provider referred patient to a clinical therapist. Patient reported she plans to attend the appointment with clinical therapist and reported she will inform this clinician if she plans to continue therapy with the other provider. Patient reported experiencing physical side effects from ozempic and reported difficulty consuming food/beverages as a result.   Interventions: Cognitive Behavioral Therapy. Clinician conducted session via WebEx video from clinician's home office. Patient provided verbal consent to proceed with telehealth session and participated in session from patient's car. Discussed patient's recent appointment with new primary care provider and patient's response to new medication. Provided psycho education related to psychotropic medications. Processed patient's thoughts/feelings regarding her son's upcoming move. Discussed primary care provider referring patient to a clinical therapist and patient's thoughts/feelings regarding the referral, as well as, patient's plan as it relates to continuing therapy with this clinician or transition to another provider. Provided supportive therapy, active listening, and validation as patient discussed side effects from ozempic.     Diagnosis:  Major Depressive Disorder, recurrent, severe without psychotic features   Generalized Anxiety Disorder   Plan: Patient is to utilize Cognitive Behavioral Therapy, thought re-framing, relaxation techniques, progressive muscle relaxation, dialectical behavioral therapy skills, and coping strategies to decrease symptoms associated with Major Depressive Disorder and Generalized Anxiety Disorder. Frequency: weekly  Modality: individual      Long-term goal:   Patient stated, "I want to experience a piece of mind and the calmness".     Reduce overall level, frequency, and intensity of the feelings of depression and anxiety as evidenced by decreased anger, agitation, depressed mood, frustration,  feelings of worthlessness, difficulty staying asleep, fatigue, decreased concentration,  and loss of interest from 7 days per week to 3 to 4 days per week per patient's report for at least 6 consecutive months.   Target Date: 06/09/23  Progress: progressing    Short-term goal:  Identify and verbally express contributing factors to patient's internal happiness per patient's report   Target Date: 12/07/22  Progress: progressing      Decrease feelings of anger, depression and anxiety by developing healthy coping mechanisms   Target Date: 12/07/22  Progress: progressing    Identify, challenge, and replace negative core beliefs, thought patterns, and negative self talk that contribute to feelings of depression, anxiety, and low self esteem with positive thoughts and beliefs per patient's report   Target Date: 12/07/22  Progress: progressing    Katherina Right, LCSW

## 2022-08-18 NOTE — Progress Notes (Signed)
                Katherina Right, LCSW

## 2022-09-14 ENCOUNTER — Ambulatory Visit (INDEPENDENT_AMBULATORY_CARE_PROVIDER_SITE_OTHER): Payer: Medicare HMO | Admitting: Clinical

## 2022-09-14 DIAGNOSIS — F411 Generalized anxiety disorder: Secondary | ICD-10-CM | POA: Diagnosis not present

## 2022-09-14 DIAGNOSIS — F332 Major depressive disorder, recurrent severe without psychotic features: Secondary | ICD-10-CM | POA: Diagnosis not present

## 2022-09-14 NOTE — Progress Notes (Signed)
                Laken Lobato, LCSW 

## 2022-09-14 NOTE — Progress Notes (Signed)
[facility] Counselor/Therapist Progress Note  Patient ID: [name], MRN: [id number],    Date: 09/14/2022  Time Spent: 2:30pm-3:24pm : 54 minutes   Treatment Type: Individual Therapy  Reported Symptoms: Patient reported symptoms of anxiety and anger  Mental Status Exam: Appearance:  Well Groomed     Behavior: Appropriate  Motor: Normal  Speech/Language:  Clear and Coherent  Affect: Appropriate  Mood: normal  Thought process: normal  Thought content:   WNL  Sensory/Perceptual disturbances:   WNL  Orientation: oriented to person, place, and situation  Attention: Good  Concentration: Good  Memory: WNL  Fund of knowledge:  Good  Insight:   Fair  Judgment:  Good  Impulse Control: Fair   Risk Assessment: Danger to Self:  No Patient denied current suicidal ideation. Patient reported experiencing suicidal ideation after her son moved out but denied plan or intent Self-injurious Behavior: No Danger to Others: No Patient denied current homicidal ideation Duty to Warn:no Physical Aggression / Violence:No  Access to Firearms a concern: No  Gang Involvement:No   Subjective: Patient reported her son recently moved out of the home and stated, "its getting better" in response to recent transition. Patient reported she feels her son realizes he now has to compromise when living with another individual. Patient stated, "I have to reinvent myself" in response to son moving out. 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 Patient stated, "I'm looking forward to it" in response to potential move. Patient reported she resumed baking since her son moved out and reported feeling that is helpful. Patient stated, "it took the anxiety pain away" in response to recent medication but reported she still experiences "thoughts that creep in". Patient stated, "I have a harsh reaction when people do things I don't  like". Patient reported a co-worker's recent response when patient greeted co-worker and her neighbor are recent triggers for anger. Patient reported feelings of anxiety related to going to work and reported she experiences anger in response to situations at work. 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 Patient reported a history of anger outbursts and retaliation when angry. Patient reported talking with others, such as, her son, her dog, and in therapy until the anger subsides is a coping mechanism.   Interventions: Cognitive Behavioral Therapy. Clinician conducted session initially via Webex video from clinician's office at [facility] but later had to transition to telephone session due to technical issues with audio/video connection. Patient provided verbal consent to proceed with telehealth session and participated in session from patient's home. Discussed patient's thoughts/feelings associated with son's recent move and patient's response. Processed patient's thoughts and feelings related to potential move to [place]. Explored and identified triggers for anger. Discussed coping strategies, alternative perspectives, and potential consequences to patient's responses to anger. Discussed taking deep breaths in response to triggers and feelings of anger. Discussed status of primary care provider's referral to clinical therapist.     Diagnosis:  Major Depressive Disorder, recurrent, severe without psychotic features   Generalized Anxiety Disorder   Plan: Patient is to utilize Cognitive Behavioral Therapy, thought re-framing, relaxation techniques, progressive muscle relaxation, dialectical behavioral therapy skills, and coping strategies to decrease symptoms associated with Major Depressive Disorder and Generalized Anxiety Disorder. Frequency:  weekly  Modality: individual      Long-term goal:  Patient stated, "I want to experience a piece of mind and the calmness".     Reduce overall level, frequency, and intensity of the feelings of depression and anxiety as evidenced by decreased anger, agitation, depressed mood, frustration, feelings of worthlessness, difficulty staying asleep, fatigue, decreased concentration, and loss of interest from 7 days per week to 3 to 4 days per week per patient's report for at least 6 consecutive months.   Target Date: 06/09/23  Progress: progressing    Short-term goal:  Identify and verbally express contributing factors to patient's internal happiness per patient's report   Target Date: 12/07/22  Progress: progressing      Decrease feelings of anger, depression and anxiety by developing healthy coping mechanisms   Target Date: 12/07/22  Progress: progressing    Identify, challenge, and replace negative core beliefs, thought patterns, and negative self talk that contribute to feelings of depression, anxiety, and low self esteem with positive thoughts and beliefs per patient's report   Target Date: 12/07/22  Progress: progressing     Katherina Right, LCSW

## 2022-10-06 ENCOUNTER — Ambulatory Visit (INDEPENDENT_AMBULATORY_CARE_PROVIDER_SITE_OTHER): Payer: Medicare HMO | Admitting: Clinical

## 2022-10-06 DIAGNOSIS — F332 Major depressive disorder, recurrent severe without psychotic features: Secondary | ICD-10-CM

## 2022-10-06 DIAGNOSIS — F411 Generalized anxiety disorder: Secondary | ICD-10-CM

## 2022-10-06 NOTE — Progress Notes (Signed)
[facility] Counselor/Therapist Progress Note  Patient ID: [name], MRN: [id number],    Date: 10/06/2022  Time Spent: 2:31pm - 3:14pm : 43 minutes   Treatment Type: Individual Therapy  Reported Symptoms: Patient reported feeling worthless and tired.   Mental Status Exam: Appearance:  Well Groomed     Behavior: Appropriate  Motor: Normal  Speech/Language:  Clear and Coherent  Affect: Flat  Mood: Patient stated, "indifferent" in response to current mood  Thought process: normal  Thought content:   WNL  Sensory/Perceptual disturbances:   WNL  Orientation: oriented to person, place, and situation  Attention: Good  Concentration: Good  Memory: WNL  Fund of knowledge:  Good  Insight:   Fair  Judgment:  Good  Impulse Control: Fair   Risk Assessment: Danger to Self:  No Patient denied current suicidal ideation  Self-injurious Behavior: No Danger to Others: No Patient denied current homicidal ideation  Duty to Warn:no Physical Aggression / Violence:No  Access to Firearms a concern: No  Gang Involvement:No   Subjective: Patient stated, "really rough" in response to events since last session. Patient reported triggering events since last session. Patient stated, "I thought everything would be fine and better and wouldn't need these session" in regards to Prozac.  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 Patient stated, "it bothered me" in regards to the way the child approached patient. Patient reported she felt her boundaries were not respected by the student in that situation. Patient reported she notified another teacher of the situation. 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 Patient stated, "I feel worthless", "I feel tired". Patient reported she thought the Prozac  would alleviate symptoms. Patient reported experiencing anxiety and worry related to her dog, her son, finances, physical pain, side effects from ozempic, and stated she worries about  "being a victim". Patient reported fear that she is going to be hurt. Patient stated, "I'll think about it" in response to intensive outpatient treatment.   Interventions: Cognitive Behavioral Therapy. Clinician conducted session via Webex video from clinician's home office. Patient provided verbal consent to proceed with telehealth session and participated in session from patient's car. Reviewed events since last session. Discussed patient's thoughts related to psychotropic medication and patient's expectation as it relates to the medication. Provided psycho education related to psychotropic medications. Provided supportive therapy, active and reflective listening, and validation as patient discussed recent trigger at her place of employment and patient's response to the situation. Explored patient's thoughts/feelings associated with recent triggers and feelings of anxiety. Discussed recommendation for intensive outpatient treatment and patient's response.     Diagnosis:  Major Depressive Disorder, recurrent, severe without psychotic features   Generalized Anxiety Disorder   Plan: Patient is to utilize Cognitive Behavioral Therapy, thought re-framing, relaxation techniques, progressive muscle relaxation, dialectical behavioral therapy skills, and coping strategies to decrease symptoms associated with Major Depressive Disorder and Generalized Anxiety Disorder. Frequency: weekly  Modality: individual      Long-term goal:   Patient stated, "I want to experience a piece of mind and the calmness".     Reduce overall level, frequency, and intensity of the feelings of depression and anxiety as evidenced by decreased anger, agitation, depressed mood, frustration, feelings of worthlessness, difficulty staying asleep, fatigue,  decreased concentration, and loss of interest from 7 days per week  to 3 to 4 days per week per patient's report for at least 6 consecutive months.   Target Date: 06/09/23  Progress: progressing    Short-term goal:  Identify and verbally express contributing factors to patient's internal happiness per patient's report   Target Date: 12/07/22  Progress: progressing      Decrease feelings of anger, depression and anxiety by developing healthy coping mechanisms   Target Date: 12/07/22  Progress: progressing    Identify, challenge, and replace negative core beliefs, thought patterns, and negative self talk that contribute to feelings of depression, anxiety, and low self esteem with positive thoughts and beliefs per patient's report   Target Date: 12/07/22  Progress: progressing     Katherina Right, LCSW

## 2022-10-06 NOTE — Progress Notes (Signed)
                Ayonna Speranza, LCSW 

## 2022-10-23 ENCOUNTER — Ambulatory Visit: Payer: Medicare HMO | Admitting: Clinical

## 2022-10-30 ENCOUNTER — Ambulatory Visit: Payer: Medicare HMO | Admitting: Clinical

## 2022-11-06 ENCOUNTER — Ambulatory Visit: Payer: Medicare HMO | Admitting: Clinical

## 2022-11-17 ENCOUNTER — Ambulatory Visit: Payer: Medicare HMO | Admitting: Clinical

## 2022-12-13 ENCOUNTER — Other Ambulatory Visit: Payer: Self-pay | Admitting: Gastroenterology

## 2023-02-07 ENCOUNTER — Encounter (HOSPITAL_COMMUNITY): Payer: Self-pay | Admitting: Gastroenterology

## 2023-02-07 NOTE — Progress Notes (Signed)
Attempted to obtain medical history via telephone, unable to reach at this time. Unable to leave voicemail to return pre surgical testing department's phone call,due to mailbox not set up.   

## 2023-05-25 NOTE — Progress Notes (Addendum)
Anesthesia Review:  PCP: Delbert Harness- LOV 02/20/23  Cardiologist : Chest x-ray : EKG : Echo : Stress test: Cardiac Cath :  Activity level:  Sleep Study/ CPAP : Fasting Blood Sugar :      / Checks Blood Sugar -- times a day:   Blood Thinner/ Instructions /Last Dose: ASA / Instructions/ Last Dose :    DM- type 2  Trulicity- hold for 7 days prior  Ozempic- Hold for 7 days prior  [place]je[place]-    LVMM on 10/09/04/22.

## 2023-06-01 ENCOUNTER — Encounter (HOSPITAL_COMMUNITY): Admission: RE | Payer: Self-pay | Source: Ambulatory Visit

## 2023-06-01 ENCOUNTER — Ambulatory Visit (HOSPITAL_COMMUNITY): Admission: RE | Admit: 2023-06-01 | Payer: Medicare HMO | Source: Ambulatory Visit | Admitting: Gastroenterology

## 2023-06-01 SURGERY — ESOPHAGOGASTRODUODENOSCOPY (EGD) WITH PROPOFOL
Anesthesia: Monitor Anesthesia Care

## 2023-08-27 DIAGNOSIS — E1165 Type 2 diabetes mellitus with hyperglycemia: Secondary | ICD-10-CM | POA: Diagnosis not present

## 2023-08-27 DIAGNOSIS — I10 Essential (primary) hypertension: Secondary | ICD-10-CM | POA: Diagnosis not present

## 2023-08-27 DIAGNOSIS — E785 Hyperlipidemia, unspecified: Secondary | ICD-10-CM | POA: Diagnosis not present

## 2023-09-18 DIAGNOSIS — E785 Hyperlipidemia, unspecified: Secondary | ICD-10-CM | POA: Diagnosis not present

## 2023-09-18 DIAGNOSIS — Z139 Encounter for screening, unspecified: Secondary | ICD-10-CM | POA: Diagnosis not present

## 2023-09-18 DIAGNOSIS — F331 Major depressive disorder, recurrent, moderate: Secondary | ICD-10-CM | POA: Diagnosis not present

## 2023-09-18 DIAGNOSIS — I10 Essential (primary) hypertension: Secondary | ICD-10-CM | POA: Diagnosis not present

## 2023-09-18 DIAGNOSIS — E113413 Type 2 diabetes mellitus with severe nonproliferative diabetic retinopathy with macular edema, bilateral: Secondary | ICD-10-CM | POA: Diagnosis not present

## 2023-09-18 DIAGNOSIS — Z748 Other problems related to care provider dependency: Secondary | ICD-10-CM | POA: Diagnosis not present
